# Patient Record
Sex: Female | Born: 1980 | Race: White | Hispanic: No | Marital: Single | Smoking: Never smoker
Health system: Southern US, Community
[De-identification: ages and names within clinical notes are randomized; demographics above are authoritative.]

## PROBLEM LIST (undated history)

## (undated) DIAGNOSIS — Z8759 Personal history of other complications of pregnancy, childbirth and the puerperium: Secondary | ICD-10-CM

## (undated) DIAGNOSIS — Z8619 Personal history of other infectious and parasitic diseases: Secondary | ICD-10-CM

## (undated) HISTORY — PX: WISDOM TOOTH EXTRACTION: SHX21

## (undated) HISTORY — DX: Personal history of other complications of pregnancy, childbirth and the puerperium: Z87.59

## (undated) HISTORY — DX: Personal history of other infectious and parasitic diseases: Z86.19

---

## 2004-11-26 ENCOUNTER — Emergency Department (HOSPITAL_COMMUNITY): Admission: EM | Admit: 2004-11-26 | Discharge: 2004-11-26 | Payer: Self-pay | Admitting: Emergency Medicine

## 2006-04-06 IMAGING — CR DG FOOT COMPLETE 3+V*R*
3 series · 3 of 3 positions shown · non-contrast
Comparison: None.

CLINICAL DATA: Fell and injured right ankle. The lateral pain and swelling.

RIGHT ANKLE - 3 VIEW  11/26/2004:

[t foot ap right]
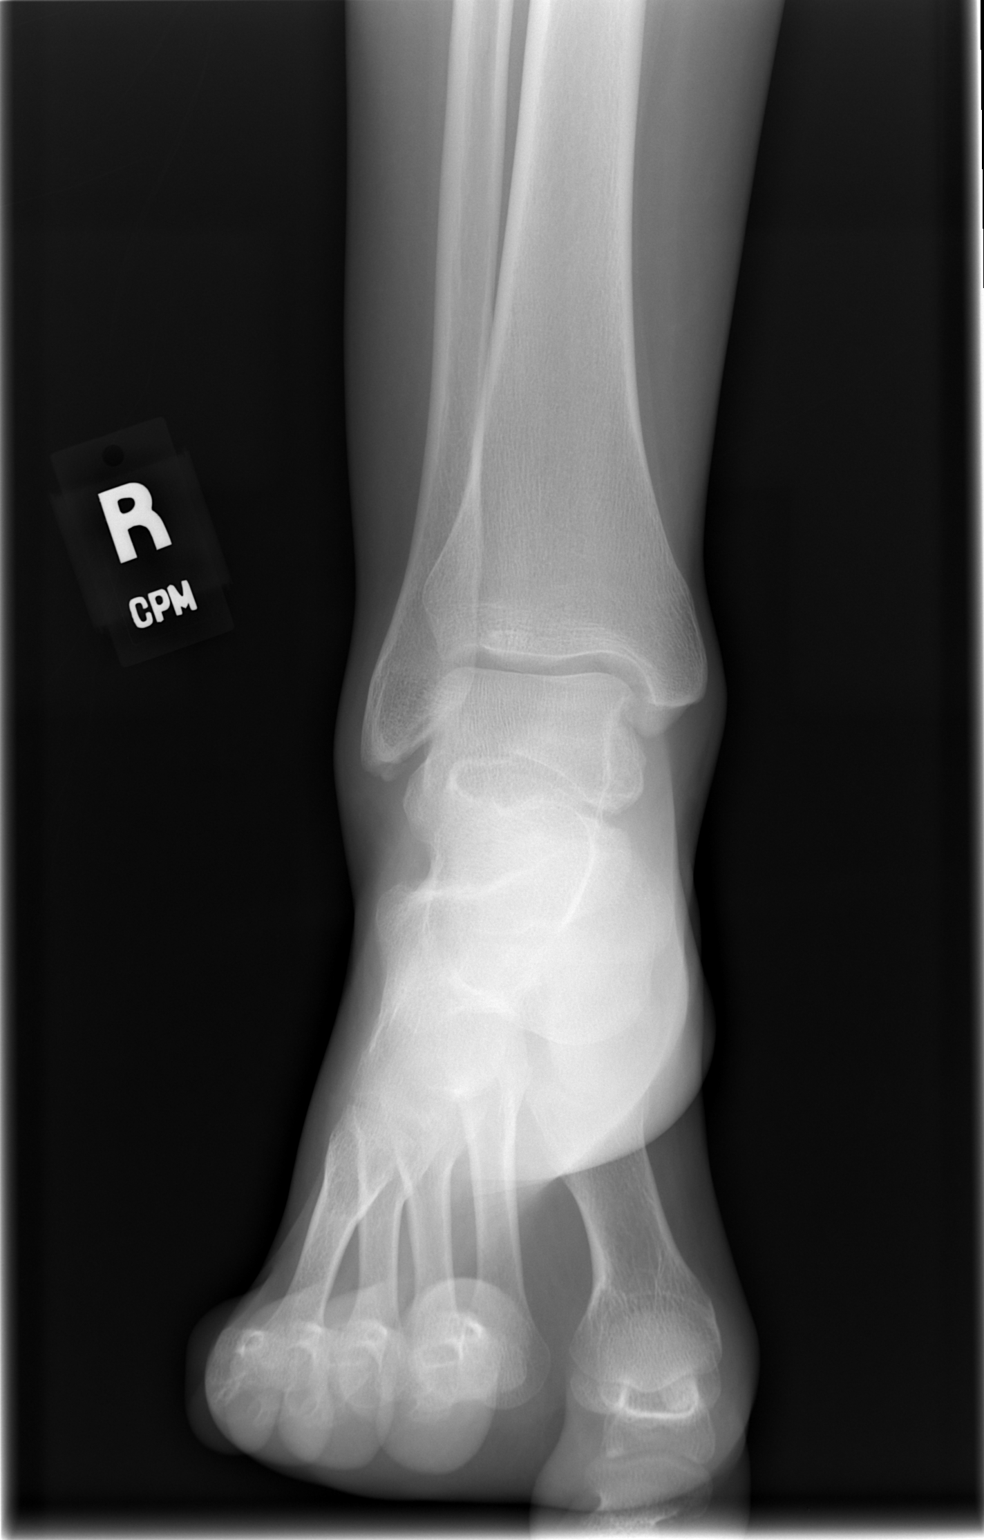

[t foot oblique right]
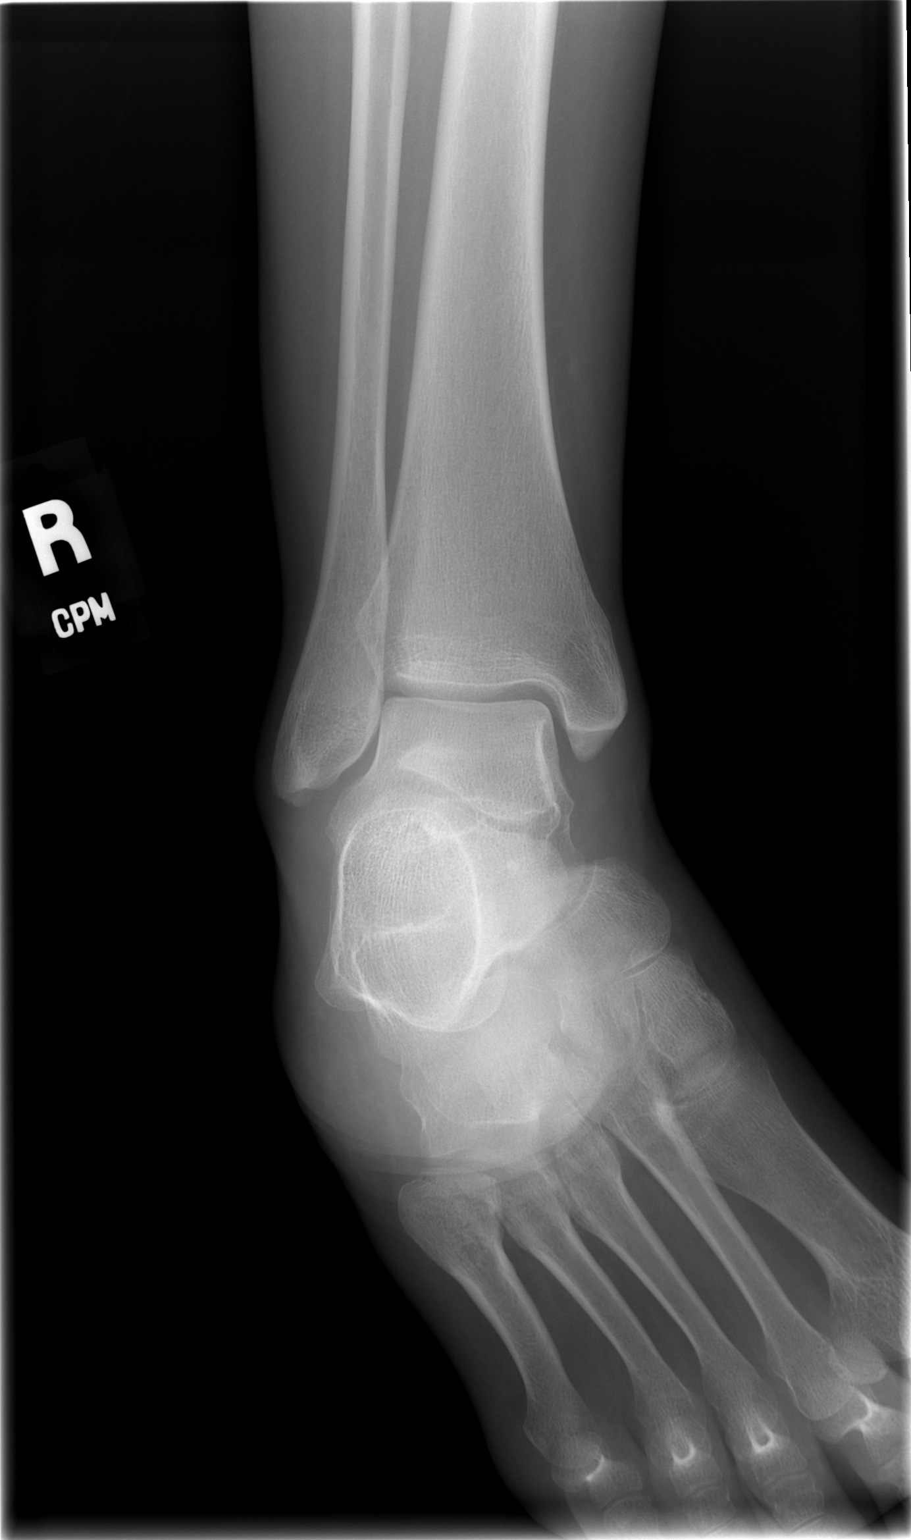

[t foot lat right]
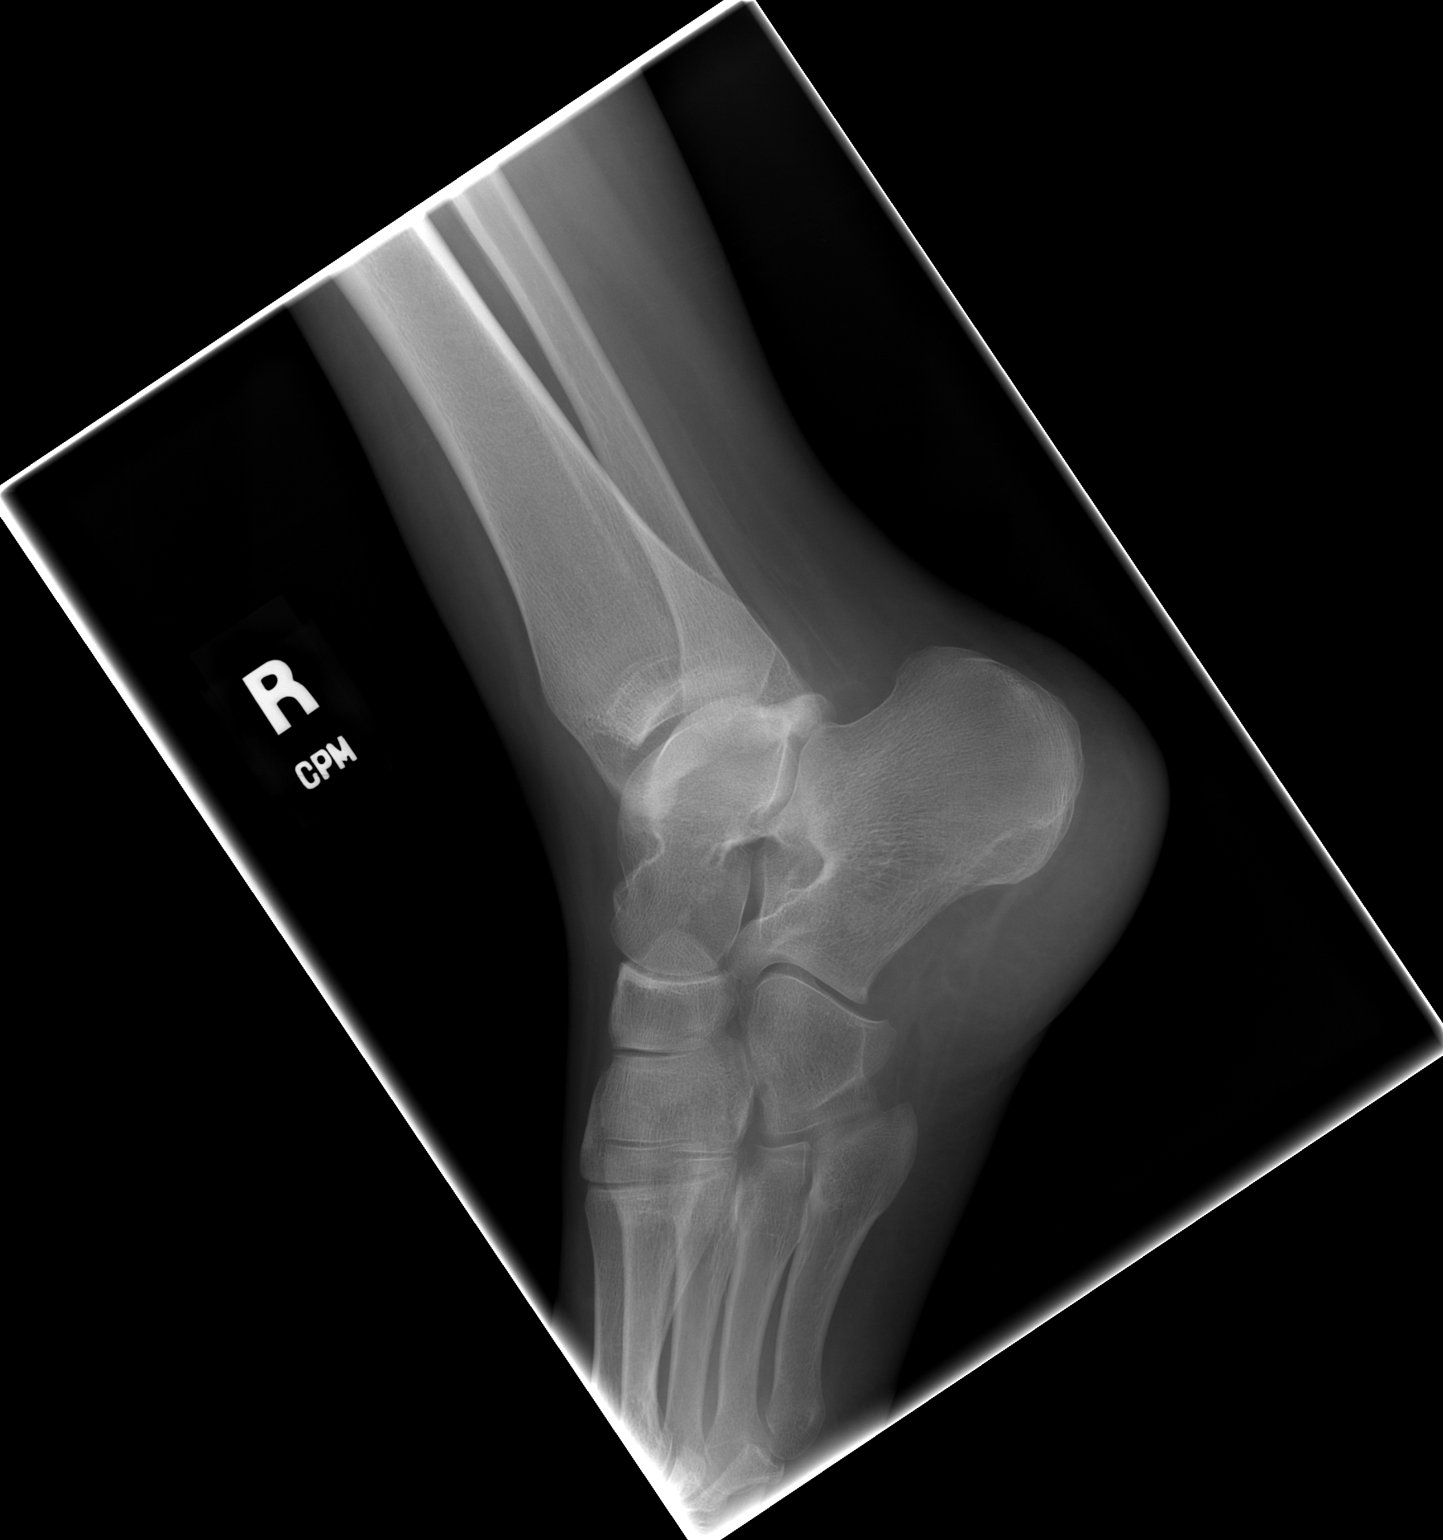

[3 of 3 positions shown; findings below may reference images not displayed]

FINDINGS: There is a small avulsion fracture arising from the inner aspect of
the tip in the lateral malleolus. No other fractures are identified. The ankle
mortise is intact. Lateral and medial soft tissue swelling is present.
IMPRESSION: Small avulsion fracture arising from the tip of the lateral malleolus.

## 2006-07-31 ENCOUNTER — Other Ambulatory Visit: Admission: RE | Admit: 2006-07-31 | Discharge: 2006-07-31 | Payer: Self-pay | Admitting: Endocrinology

## 2008-09-30 ENCOUNTER — Other Ambulatory Visit: Admission: RE | Admit: 2008-09-30 | Discharge: 2008-09-30 | Payer: Self-pay | Admitting: Family Medicine

## 2010-04-30 NOTE — L&D Delivery Note (Signed)
Delivery Note At 10:50 PM a viable and healthy female was delivered via Vaginal, Spontaneous Delivery (Presentation: Right Occiput Anterior).  APGAR: 9, 9; weight 6 lb 6.7 oz (2910 g).   Placenta status: Intact, Spontaneous.  Cord: 3 vessels with the following complications: None.   Anesthesia: Epidural  Episiotomy: Median Lacerations: None Suture Repair: 3.0 vicryl Est. Blood Loss (mL): 400  Mom to postpartum.  Baby to nursery-stable.  Amardeep Beckers D 11/20/2010, 11:08 PM

## 2010-06-09 LAB — ABO/RH: RH Type: POSITIVE

## 2010-06-09 LAB — HIV ANTIBODY (ROUTINE TESTING W REFLEX): HIV: NONREACTIVE

## 2010-06-09 LAB — HEPATITIS B SURFACE ANTIGEN: Hepatitis B Surface Ag: NEGATIVE

## 2010-06-09 LAB — TYPE AND SCREEN: Antibody Screen: NEGATIVE

## 2010-11-19 ENCOUNTER — Inpatient Hospital Stay (HOSPITAL_COMMUNITY): Admission: RE | Admit: 2010-11-19 | Payer: Self-pay | Source: Ambulatory Visit

## 2010-11-19 ENCOUNTER — Inpatient Hospital Stay (HOSPITAL_COMMUNITY)
Admission: AD | Admit: 2010-11-19 | Discharge: 2010-11-22 | DRG: 774 | Disposition: A | Discharge status: Home / Self Care | Payer: Self-pay | Source: Ambulatory Visit | Attending: Obstetrics and Gynecology | Admitting: Obstetrics and Gynecology

## 2010-11-19 ENCOUNTER — Encounter (HOSPITAL_COMMUNITY): Payer: Self-pay | Admitting: *Deleted

## 2010-11-19 ENCOUNTER — Other Ambulatory Visit: Payer: Self-pay | Admitting: Obstetrics and Gynecology

## 2010-11-19 DIAGNOSIS — IMO0002 Reserved for concepts with insufficient information to code with codable children: Principal | ICD-10-CM | POA: Diagnosis present

## 2010-11-19 DIAGNOSIS — O99892 Other specified diseases and conditions complicating childbirth: Secondary | ICD-10-CM | POA: Diagnosis present

## 2010-11-19 DIAGNOSIS — Z22330 Carrier of Group B streptococcus: Secondary | ICD-10-CM

## 2010-11-19 DIAGNOSIS — O1400 Mild to moderate pre-eclampsia, unspecified trimester: Secondary | ICD-10-CM

## 2010-11-19 LAB — CBC
HCT: 33.6 % — ABNORMAL LOW (ref 36.0–46.0)
MCV: 88.9 fL (ref 78.0–100.0)
RBC: 3.78 MIL/uL — ABNORMAL LOW (ref 3.87–5.11)
WBC: 9.8 10*3/uL (ref 4.0–10.5)

## 2010-11-19 LAB — URIC ACID: Uric Acid, Serum: 5.6 mg/dL (ref 2.4–7.0)

## 2010-11-19 MED ORDER — LIDOCAINE HCL (PF) 1 % IJ SOLN
30.0000 mL | INTRAMUSCULAR | Status: DC | PRN
  Filled 2010-11-19: qty 30

## 2010-11-19 MED ORDER — LACTATED RINGERS IV SOLN
INTRAVENOUS | Status: DC
  Administered 2010-11-19 – 2010-11-20 (×3): via INTRAVENOUS

## 2010-11-19 MED ORDER — LACTATED RINGERS IV SOLN: 500.0000 mL | INTRAVENOUS | Status: DC | PRN

## 2010-11-19 MED ORDER — CITRIC ACID-SODIUM CITRATE 334-500 MG/5ML PO SOLN: 30.0000 mL | ORAL | Status: DC | PRN

## 2010-11-19 MED ORDER — ZOLPIDEM TARTRATE 10 MG PO TABS
10.0000 mg | ORAL_TABLET | Freq: Every evening | ORAL | Status: DC | PRN
  Administered 2010-11-20: 10 mg via ORAL
  Filled 2010-11-19: qty 1

## 2010-11-19 MED ORDER — PENICILLIN G POTASSIUM 5000000 UNITS IJ SOLR
2.5000 10*6.[IU] | INTRAVENOUS | Status: DC
  Administered 2010-11-20 (×6): 2.5 10*6.[IU] via INTRAVENOUS
  Filled 2010-11-19 (×11): qty 2.5

## 2010-11-19 MED ORDER — ACETAMINOPHEN 325 MG PO TABS: 650.0000 mg | ORAL_TABLET | ORAL | Status: DC | PRN

## 2010-11-19 MED ORDER — ONDANSETRON HCL 4 MG/2ML IJ SOLN: 4.0000 mg | Freq: Four times a day (QID) | INTRAMUSCULAR | Status: DC | PRN

## 2010-11-19 MED ORDER — LIDOCAINE HCL (PF) 1 % IJ SOLN: 30.0000 mL | INTRAMUSCULAR | Status: DC | PRN

## 2010-11-19 MED ORDER — IBUPROFEN 600 MG PO TABS: 600.0000 mg | ORAL_TABLET | Freq: Four times a day (QID) | ORAL | Status: DC | PRN

## 2010-11-19 MED ORDER — OXYTOCIN 20 UNITS IN LACTATED RINGERS INFUSION - SIMPLE
125.0000 mL/h | Freq: Once | INTRAVENOUS | Status: DC
  Administered 2010-11-20: 125 mL/h via INTRAVENOUS

## 2010-11-19 MED ORDER — PENICILLIN G POTASSIUM 5000000 UNITS IJ SOLR
5.0000 10*6.[IU] | Freq: Once | INTRAMUSCULAR | Status: DC
  Administered 2010-11-19: 5 10*6.[IU] via INTRAVENOUS
  Filled 2010-11-19: qty 5

## 2010-11-19 MED ORDER — TERBUTALINE SULFATE 1 MG/ML IJ SOLN: 0.2500 mg | Freq: Once | INTRAMUSCULAR | Status: AC | PRN

## 2010-11-19 MED ORDER — OXYCODONE-ACETAMINOPHEN 5-325 MG PO TABS: 2.0000 | ORAL_TABLET | ORAL | Status: DC | PRN

## 2010-11-19 MED ORDER — MISOPROSTOL 25 MCG QUARTER TABLET
25.0000 ug | ORAL_TABLET | ORAL | Status: DC | PRN
  Administered 2010-11-19 – 2010-11-20 (×3): 25 ug via VAGINAL
  Filled 2010-11-19 (×3): qty 0.25

## 2010-11-19 MED ORDER — LACTATED RINGERS IV SOLN
500.0000 mL | INTRAVENOUS | Status: DC | PRN
  Administered 2010-11-20: 1000 mL via INTRAVENOUS

## 2010-11-19 MED ORDER — OXYTOCIN 20 UNITS IN LACTATED RINGERS INFUSION - SIMPLE: 125.0000 mL/h | Freq: Once | INTRAVENOUS | Status: DC

## 2010-11-19 MED ORDER — LACTATED RINGERS IV SOLN: INTRAVENOUS | Status: DC

## 2010-11-19 NOTE — H&P (Signed)
NAMEDODI, LEU               ACCOUNT NO.:  [id number]  MEDICAL RECORD NO.:  [id number]  LOCATION:  9169                          FACILITY:  [facility]  PHYSICIAN:  Zenaida Niece, M.D.DATE OF BIRTH:  [birth year]  DATE OF ADMISSION:  11/19/2010 DATE OF DISCHARGE:                             HISTORY & PHYSICAL   CHIEF COMPLAINT:  Intrauterine pregnancy at 37 weeks with preeclampsia.  HISTORY OF PRESENT ILLNESS:  This is a 30 year old gravida 1, para 0 with an EGA of [redacted] weeks by an early ultrasound with a due date of December 09, 2010 who is admitted for cervical ripening and induction due to mild preeclampsia.  Prenatal care was fairly uncomplicated until she was seen on November 08, 2010.  At that time, blood pressure was okay at 134/80 but she had 1+ proteinuria.  She had no PIH symptoms at that time.  PIH labs at that point were normal.  An initial 24-hour urine showed less than 300 mg of protein.  Repeat 24-hour urine revealed 600 mg plus of protein.  She still feels fine but due to mild preeclampsia is being admitted for cervical ripening and induction.  Prenatal labs significant for a blood type of O+ with a negative antibody screen.  Rubella immune, hepatitis B surface antigen negative, HIV negative, gonorrhea and Chlamydia negative.  First trimester screen is normal.  MSAFP is normal. One-hour Glucola 99 and group B strep is positive.  PAST MEDICAL HISTORY:  Negative.  PAST SURGICAL HISTORY:  Wisdom tooth extraction.  ALLERGIES:  None known.  CURRENT MEDICATIONS:  None.  FAMILY HISTORY:  No GYN or colon cancer.  REVIEW OF SYSTEMS:  Normal complaints of pregnancy.  SOCIAL HISTORY:  She is single and the father of baby is involved.  She denies alcohol, tobacco, or drug use.  PHYSICAL EXAMINATION:  GENERAL:  This is a well-developed gravid female in no acute distress. VITAL SIGNS:  Last weight in the office is 202 pounds and blood pressure is 142/90. NECK:  Supple without  lymphadenopathy or thyromegaly. LUNGS:  Clear to auscultation. HEART:  Regular rate and rhythm without murmur. ABDOMEN:  Gravid, nontender with a fundal height of 37 cm and estimated fetal weight of 7-7.5 pounds. GU:  Cervix is fingertip, 30, and -3 with a vertex presentation.  ASSESSMENT:  Intrauterine pregnancy at 37+ weeks with mild preeclampsia. The patient has no significant symptoms but blood pressures are slightly elevated and she has a proteinuria making a diagnosis of preeclampsia. Her other labs have been normal.  PLAN:  To admit the patient on the evening of November 19, 2010 for cervical ripening with Cytotec followed hopefully by induction with Pitocin.     Zenaida Niece, M.D.     TDM/MEDQ  D:  11/19/2010  T:  11/19/2010  Job:  [id number]

## 2010-11-20 ENCOUNTER — Encounter (HOSPITAL_COMMUNITY): Payer: Self-pay | Admitting: *Deleted

## 2010-11-20 ENCOUNTER — Inpatient Hospital Stay (HOSPITAL_COMMUNITY): Payer: Self-pay | Admitting: Anesthesiology

## 2010-11-20 ENCOUNTER — Encounter (HOSPITAL_COMMUNITY): Payer: Self-pay | Admitting: Anesthesiology

## 2010-11-20 DIAGNOSIS — O1400 Mild to moderate pre-eclampsia, unspecified trimester: Secondary | ICD-10-CM | POA: Diagnosis present

## 2010-11-20 LAB — CBC
HCT: 35 % — ABNORMAL LOW (ref 36.0–46.0)
MCHC: 33.1 g/dL (ref 30.0–36.0)
MCV: 88.6 fL (ref 78.0–100.0)
RDW: 12.8 % (ref 11.5–15.5)

## 2010-11-20 LAB — COMPREHENSIVE METABOLIC PANEL
ALT: 12 U/L (ref 0–35)
AST: 18 U/L (ref 0–37)
CO2: 23 mEq/L (ref 19–32)
Chloride: 102 mEq/L (ref 96–112)
GFR calc non Af Amer: >60 mL/min (ref >60)
Potassium: 3.9 mEq/L (ref 3.5–5.1)
Sodium: 134 mEq/L — ABNORMAL LOW (ref 135–145)
Total Bilirubin: 0.2 mg/dL — ABNORMAL LOW (ref 0.3–1.2)

## 2010-11-20 MED ORDER — LACTATED RINGERS IV SOLN: 500.0000 mL | Freq: Once | INTRAVENOUS | Status: DC

## 2010-11-20 MED ORDER — PHENYLEPHRINE 40 MCG/ML (10ML) SYRINGE FOR IV PUSH (FOR BLOOD PRESSURE SUPPORT): 80.0000 ug | PREFILLED_SYRINGE | INTRAVENOUS | Status: DC | PRN

## 2010-11-20 MED ORDER — OXYTOCIN 20 UNITS IN LACTATED RINGERS INFUSION - SIMPLE
1.0000 m[IU]/min | INTRAVENOUS | Status: DC
  Administered 2010-11-20: 1 m[IU]/min via INTRAVENOUS
  Administered 2010-11-20: 2 m[IU]/min via INTRAVENOUS
  Administered 2010-11-20: 6 m[IU]/min via INTRAVENOUS
  Filled 2010-11-20: qty 1000

## 2010-11-20 MED ORDER — PHENYLEPHRINE 40 MCG/ML (10ML) SYRINGE FOR IV PUSH (FOR BLOOD PRESSURE SUPPORT)
80.0000 ug | PREFILLED_SYRINGE | INTRAVENOUS | Status: DC | PRN
  Filled 2010-11-20: qty 5

## 2010-11-20 MED ORDER — LIDOCAINE HCL 1.5 % IJ SOLN
INTRAMUSCULAR | Status: DC | PRN
  Administered 2010-11-20: 5 mL
  Administered 2010-11-20: 3 mL
  Administered 2010-11-20: 5 mL

## 2010-11-20 MED ORDER — FENTANYL 2.5 MCG/ML BUPIVACAINE 1/10 % EPIDURAL INFUSION (WH - ANES)
14.0000 mL/h | INTRAMUSCULAR | Status: DC
  Administered 2010-11-20 (×2): 14 mL/h via EPIDURAL
  Filled 2010-11-20 (×2): qty 60

## 2010-11-20 MED ORDER — EPHEDRINE 5 MG/ML INJ
10.0000 mg | INTRAVENOUS | Status: DC | PRN
  Filled 2010-11-20: qty 4

## 2010-11-20 MED ORDER — EPHEDRINE 5 MG/ML INJ: 10.0000 mg | INTRAVENOUS | Status: DC | PRN

## 2010-11-20 MED ORDER — TERBUTALINE SULFATE 1 MG/ML IJ SOLN: 0.2500 mg | Freq: Once | INTRAMUSCULAR | Status: AC | PRN

## 2010-11-20 MED ORDER — DIPHENHYDRAMINE HCL 50 MG/ML IJ SOLN: 12.5000 mg | INTRAMUSCULAR | Status: DC | PRN

## 2010-11-20 NOTE — Progress Notes (Signed)
Feeling some ctx Afeb, VSS, BP 140/80 FHT- Cat I, irregular ctx on pitocin VE- 1-2/50/-2, vtx, AROM clear CMP essentially nl Continue pitocin and monitor progress, continue PCN for GBS

## 2010-11-20 NOTE — Anesthesia Procedure Notes (Signed)
Epidural Patient location during procedure: OB Start time: 11/20/2010 4:22 PM Reason for block: procedure for pain  Staffing Anesthesiologist: Layson Bertsch L.  Preanesthetic Checklist Completed: patient identified, site marked, surgical consent, pre-op evaluation, timeout performed, IV checked, risks and benefits discussed and monitors and equipment checked  Epidural Patient position: sitting Prep: site prepped and draped and DuraPrep Patient monitoring: continuous pulse ox and blood pressure Approach: midline Injection technique: LOR air  Needle:  Needle type: Tuohy  Needle gauge: 17 G Needle length: 9 cm Needle insertion depth: 5 cm cm Catheter type: closed end flexible Catheter size: 19 Gauge Catheter at skin depth: 10 cm Test dose: negative  Assessment Events: blood not aspirated, injection not painful, no injection resistance, negative IV test and no paresthesia  Additional Notes Discussed risk of headache, infection, bleeding, nerve injury and failed or incomplete block.  Patient voices understanding and wishes to proceed.

## 2010-11-20 NOTE — Plan of Care (Signed)
Progress Note Comfortable with epidural Afeb, VSS, BP stable FHT- Cat I, previous late decels resolved, ctx q 2-3 minutes on 1 mu/minute pitocin VE- 4/80/-1, vtx Continue pitocin and monitor FHT and progress, continue PCN for GBS

## 2010-11-20 NOTE — Progress Notes (Signed)
Dr. Jackelyn Knife notified of fhr pattern and interventions--order received to restart pitocin at 1800 with reassuring fhr

## 2010-11-20 NOTE — Anesthesia Preprocedure Evaluation (Signed)
Anesthesia Evaluation  Name, MR# and DOB Patient awake  General Assessment Comment  Reviewed: Allergy & Precautions, H&P  and Patient's Chart, lab work & pertinent test results  History of Anesthesia Complications Negative for: history of anesthetic complications  Airway Mallampati: I TM Distance: >3 FB Neck ROM: full    Dental  (+) Teeth Intact   Pulmonaryneg pulmonary ROS      pulmonary exam normal   Cardiovascular hypertension (mild preeclampsia), regular Normal   Neuro/PsychNegative Neurological ROS Negative Psych ROS  GI/Hepatic/Renal negative GI ROS, negative Liver ROS, and negative Renal ROS (+)       Endo/Other  Negative Endocrine ROS (+)   Abdominal   Musculoskeletal  Hematology negative hematology ROS (+)   Peds  Reproductive/Obstetrics (+) Pregnancy   Anesthesia Other Findings             Anesthesia Physical Anesthesia Plan  ASA: II  Anesthesia Plan: Epidural   Post-op Pain Management:    Induction:   Airway Management Planned:   Additional Equipment:   Intra-op Plan:   Post-operative Plan:   Informed Consent: I have reviewed the patients History and Physical, chart, labs and discussed the procedure including the risks, benefits and alternatives for the proposed anesthesia with the patient or authorized representative who has indicated his/her understanding and acceptance.     Plan Discussed with:   Anesthesia Plan Comments:         Anesthesia Quick Evaluation

## 2010-11-20 NOTE — Progress Notes (Signed)
Comfortable with epidural, feeling some pressure Afeb, VSS FHT- Cat II- some variable and late decels, mod variability, ctx not tracing well VE- C/C/+1 to +2 Will start pushing, anticipate SVD

## 2010-11-20 NOTE — Progress Notes (Signed)
Admitted last pm for ripening and induction for mild preeclampsia.  H&P dictated.  Received 3 doses of cytotec, last dose at 0530. Feels ok, no complaints, feeling some ctx Afeb, VSS, BP 110-140/70-90 FHT- Cat I, occ ctx Labs ok, no CMP done Will have nurse recheck cervix at 0930 and decide on pitocin vs. more cytotec.  On PCN for +GBS.  Will get CBC to complete labs for preeclampsia.

## 2010-11-21 ENCOUNTER — Inpatient Hospital Stay (HOSPITAL_COMMUNITY): Admission: RE | Admit: 2010-11-21 | Payer: Self-pay | Source: Ambulatory Visit

## 2010-11-21 LAB — CBC
HCT: 32.5 % — ABNORMAL LOW (ref 36.0–46.0)
Hemoglobin: 11 g/dL — ABNORMAL LOW (ref 12.0–15.0)
MCH: 29.8 pg (ref 26.0–34.0)
MCHC: 33.8 g/dL (ref 30.0–36.0)

## 2010-11-21 MED ORDER — ONDANSETRON HCL 4 MG/2ML IJ SOLN: 4.0000 mg | INTRAMUSCULAR | Status: DC | PRN

## 2010-11-21 MED ORDER — BD GETTING STARTED TAKE HOME KIT: 1/2ML X 30G SYRINGES
1.0000 | Freq: Once | Status: DC
  Filled 2010-11-21: qty 1

## 2010-11-21 MED ORDER — MAGNESIUM HYDROXIDE 400 MG/5ML PO SUSP: 30.0000 mL | ORAL | Status: DC | PRN

## 2010-11-21 MED ORDER — SENNOSIDES-DOCUSATE SODIUM 8.6-50 MG PO TABS: 1.0000 | ORAL_TABLET | Freq: Every day | ORAL | Status: DC

## 2010-11-21 MED ORDER — TETANUS-DIPHTH-ACELL PERTUSSIS 5-2.5-18.5 LF-MCG/0.5 IM SUSP
0.5000 mL | Freq: Once | INTRAMUSCULAR | Status: AC
  Administered 2010-11-21: 0.5 mL via INTRAMUSCULAR
  Filled 2010-11-21: qty 0.5

## 2010-11-21 MED ORDER — BENZOCAINE-MENTHOL 20-0.5 % EX AERO
INHALATION_SPRAY | CUTANEOUS | Status: AC
  Filled 2010-11-21: qty 56

## 2010-11-21 MED ORDER — IBUPROFEN 600 MG PO TABS
600.0000 mg | ORAL_TABLET | Freq: Four times a day (QID) | ORAL | Status: DC
  Administered 2010-11-21 – 2010-11-22 (×6): 600 mg via ORAL
  Filled 2010-11-21 (×6): qty 1

## 2010-11-21 MED ORDER — ZOLPIDEM TARTRATE 5 MG PO TABS: 5.0000 mg | ORAL_TABLET | Freq: Every evening | ORAL | Status: DC | PRN

## 2010-11-21 MED ORDER — DIPHENHYDRAMINE HCL 25 MG PO CAPS: 25.0000 mg | ORAL_CAPSULE | Freq: Four times a day (QID) | ORAL | Status: DC | PRN

## 2010-11-21 MED ORDER — PRENATAL PLUS 27-1 MG PO TABS: 1.0000 | ORAL_TABLET | Freq: Every day | ORAL | Status: DC

## 2010-11-21 MED ORDER — BENZOCAINE-MENTHOL 20-0.5 % EX AERO: 1.0000 "application " | INHALATION_SPRAY | CUTANEOUS | Status: DC | PRN

## 2010-11-21 MED ORDER — WITCH HAZEL-GLYCERIN EX PADS: MEDICATED_PAD | CUTANEOUS | Status: DC | PRN

## 2010-11-21 MED ORDER — SIMETHICONE 80 MG PO CHEW: 80.0000 mg | CHEWABLE_TABLET | ORAL | Status: DC | PRN

## 2010-11-21 MED ORDER — PRENATAL PLUS 27-1 MG PO TABS
1.0000 | ORAL_TABLET | Freq: Every day | ORAL | Status: DC
  Administered 2010-11-21 – 2010-11-22 (×2): 1 via ORAL
  Filled 2010-11-21 (×2): qty 1

## 2010-11-21 MED ORDER — ONDANSETRON HCL 4 MG PO TABS: 4.0000 mg | ORAL_TABLET | ORAL | Status: DC | PRN

## 2010-11-21 MED ORDER — MEASLES, MUMPS & RUBELLA VAC ~~LOC~~ INJ
0.5000 mL | INJECTION | Freq: Once | SUBCUTANEOUS | Status: DC
  Filled 2010-11-21: qty 0.5

## 2010-11-21 MED ORDER — OXYCODONE-ACETAMINOPHEN 5-325 MG PO TABS: 1.0000 | ORAL_TABLET | ORAL | Status: DC | PRN

## 2010-11-21 NOTE — Progress Notes (Signed)
UR Chart review completed.  

## 2010-11-21 NOTE — Progress Notes (Signed)
PPD#1, mild preeclampsia No problems, breastfeeding, bleeding ok Afeb, VSS, BP nl Fundus- firm, NT at U-0 Continue routine postpartum care

## 2010-11-21 NOTE — Anesthesia Postprocedure Evaluation (Signed)
  Anesthesia Post-op Note  Patient: Vanessa Herring  Procedure(s) Performed: * No procedures listed *   Patient stable following vaginal delivery.  Epidural catheter removed.

## 2010-11-21 NOTE — Progress Notes (Signed)
SW consult received for "babies who have drug screen sent." SW reviewed babies chart and there has not been any drug screens ordered.  Therefore, SW has screened out this referral as an error.  SW will only see if appropriate consult is ordered. 

## 2010-11-21 NOTE — Progress Notes (Signed)
Declined assistance, states breastfeeding going well, will call us if needed.

## 2010-11-22 NOTE — Progress Notes (Signed)
  PPD#2 No problems Afeb, VSS, BP nl D/c home

## 2010-11-22 NOTE — Discharge Summary (Signed)
Obstetric Discharge Summary Reason for Admission: induction of labor Prenatal Procedures: none Intrapartum Procedures: spontaneous vaginal delivery Postpartum Procedures: none Complications-Operative and Postpartum: none  Hemoglobin  Date Value Range Status  11/21/2010 11.0* 12.0-15.0 (g/dL) Final     HCT  Date Value Range Status  11/21/2010 32.5* 36.0-46.0 (%) Final    Discharge Diagnoses: Term Pregnancy-delivered and Preelampsia  Discharge Information: Date: 11/22/2010 Activity: pelvic rest Diet: routine Medications: Ibuprophen Condition: stable Instructions: refer to practice specific booklet Discharge to: home   Newborn Data: Live born  Information for the patient's newborn:  [name], [name] [[id number]]  female ; APGAR , ; weight ;  Home with mother.  Vanessa Herring 11/22/2010, 9:05 AM

## 2012-08-16 ENCOUNTER — Emergency Department (HOSPITAL_COMMUNITY)
Admission: EM | Admit: 2012-08-16 | Discharge: 2012-08-16 | Disposition: A | Discharge status: Home / Self Care | Payer: BC Managed Care – PPO | Attending: Emergency Medicine | Admitting: Emergency Medicine

## 2012-08-16 ENCOUNTER — Encounter (HOSPITAL_COMMUNITY): Payer: Self-pay | Admitting: Emergency Medicine

## 2012-08-16 DIAGNOSIS — IMO0002 Reserved for concepts with insufficient information to code with codable children: Secondary | ICD-10-CM | POA: Insufficient documentation

## 2012-08-16 DIAGNOSIS — T162XXA Foreign body in left ear, initial encounter: Secondary | ICD-10-CM

## 2012-08-16 DIAGNOSIS — T169XXA Foreign body in ear, unspecified ear, initial encounter: Secondary | ICD-10-CM | POA: Insufficient documentation

## 2012-08-16 DIAGNOSIS — Y92009 Unspecified place in unspecified non-institutional (private) residence as the place of occurrence of the external cause: Secondary | ICD-10-CM | POA: Insufficient documentation

## 2012-08-16 DIAGNOSIS — Y939 Activity, unspecified: Secondary | ICD-10-CM | POA: Insufficient documentation

## 2012-08-16 DIAGNOSIS — H9209 Otalgia, unspecified ear: Secondary | ICD-10-CM | POA: Insufficient documentation

## 2012-08-16 MED ORDER — NEOMYCIN-POLYMYXIN-HC 3.5-10000-1 OT SUSP: 4.0000 [drp] | Freq: Three times a day (TID) | OTIC | Status: DC

## 2012-08-16 NOTE — ED Provider Notes (Signed)
History     CSN: [id number]  Arrival date & time 08/16/12  0440   First MD Initiated Contact with Patient 08/16/12 [phone]      Chief Complaint  Patient presents with  . Foreign Body in Ear    (Consider location/radiation/quality/duration/timing/severity/associated sxs/prior treatment) Patient is a 32 y.o. female presenting with foreign body in ear. The history is provided by the patient and medical records. No language interpreter was used.  Foreign Body in Ear This is a new problem. The current episode started today. The problem occurs constantly. The problem has been unchanged. Pertinent negatives include no chills, fever, headaches, nausea or vomiting. Nothing aggravates the symptoms. Treatments tried: baby oil. The treatment provided moderate relief.   Lineth Thielke is a 32 y.o. female  with no medical history presents to the Emergency Department complaining of acute, persistent insect in left ear.  Patient states she awoke from sleep feeling the bug moving her ear. She states her husband the baby will any air which killed the [facility] however she's been unable to get it out. Associated symptoms include pain in her left ear. Patient denies fever, chills, headache, neck pain, rash, decreased hearing.  Baby oil made it better and nothing makes it worse.   History reviewed. No pertinent past medical history.  History reviewed. No pertinent past surgical history.  Family History  Problem Relation Age of Onset  . Depression Mother     History  Substance Use Topics  . Smoking status: Never Smoker   . Smokeless tobacco: Never Used  . Alcohol Use: No    OB History   Grav Para Term Preterm Abortions TAB SAB Ect Mult Living   1 1 1       1       Review of Systems  Constitutional: Negative for fever and chills.  HENT: Positive for ear pain. Negative for hearing loss, nosebleeds, neck stiffness and tinnitus.   Eyes: Negative for visual disturbance.  Gastrointestinal: Negative for  nausea and vomiting.  Allergic/Immunologic: Negative for immunocompromised state.  Neurological: Negative for light-headedness and headaches.  Hematological: Does not bruise/bleed easily.  Psychiatric/Behavioral: The patient is not nervous/anxious.     Allergies  Review of patient's allergies indicates no known allergies.  Home Medications   Current Outpatient Rx  Name  Route  Sig  Dispense  Refill  . fexofenadine (ALLEGRA) 180 MG tablet   Oral   Take 180 mg by mouth daily as needed (allergies).         . neomycin-polymyxin-hydrocortisone (CORTISPORIN) 3.5-10000-1 otic suspension   Left Ear   Place 4 drops into the left ear 3 (three) times daily. For 5 days   10 mL   0     BP 117/73  Pulse 64  Temp(Src) 97.8 F (36.6 C) (Oral)  Resp 20  SpO2 100%  Physical Exam  Nursing note and vitals reviewed. Constitutional: She appears well-developed and well-nourished. No distress.  HENT:  Head: Normocephalic and atraumatic.  Right Ear: Tympanic membrane, external ear and ear canal normal.  Left Ear: External ear normal. A foreign body (roach) is present.  Nose: Nose normal. No mucosal edema or rhinorrhea.  Mouth/Throat: Uvula is midline, oropharynx is clear and moist and mucous membranes are normal. Mucous membranes are not dry. No oropharyngeal exudate, posterior oropharyngeal edema, posterior oropharyngeal erythema or tonsillar abscesses.  Eyes: Conjunctivae are normal. No scleral icterus.  Neck: Normal range of motion.  Cardiovascular: Normal rate, regular rhythm, normal heart sounds and intact distal  pulses.   No murmur heard. Pulmonary/Chest: Effort normal and breath sounds normal. No respiratory distress. She has no wheezes.  Musculoskeletal: Normal range of motion.  Neurological: She is alert.  Speech is clear and goal oriented Moves extremities without ataxia  Skin: Skin is warm and dry. She is not diaphoretic.  Psychiatric: She has a normal mood and affect.     ED Course  FOREIGN BODY REMOVAL Date/Time: 08/16/2012 5:11 AM Performed by: Dierdre Forth Authorized by: Dierdre Forth Consent: Verbal consent obtained. Risks and benefits: risks, benefits and alternatives were discussed Consent given by: patient Patient understanding: patient states understanding of the procedure being performed Patient consent: the patient's understanding of the procedure matches consent given Procedure consent: procedure consent matches procedure scheduled Relevant documents: relevant documents present and verified Site marked: the operative site was marked Required items: required blood products, implants, devices, and special equipment available Patient identity confirmed: verbally with patient and arm band Time out: Immediately prior to procedure a "time out" was called to verify the correct patient, procedure, equipment, support staff and site/side marked as required. Body area: ear Location details: left ear Patient sedated: no Patient restrained: no Patient cooperative: yes Localization method: ENT speculum and visualized Removal mechanism: alligator forceps Complexity: simple 1 objects recovered. Objects recovered: roach Post-procedure assessment: foreign body removed Patient tolerance: Patient tolerated the procedure well with no immediate complications. Comments: Intact roach removed from L ear with alligator forceps without complication   (including critical care time)  Labs Reviewed - No data to display No results found.   1. Foreign body in ear, left, initial encounter       MDM  Erlene Senters presents with c/o insect in her ear.  On exam pt does have a roach in her L ear.  Carolin Coy removed without difficulty.  TM intact and abrasion noted to the canal wall but no visible laceration to the wall.  Ear flushed with warm soapy water afterwards.  Will d/c home with polymyxin drops and pt given ENT f/u if needed.  I have also  discussed reasons to return immediately to the ER.  Patient expresses understanding and agrees with plan.          Dahlia Client Aviyana Sonntag, PA-C 08/16/12 [phone]

## 2012-08-16 NOTE — ED Notes (Signed)
Pt alert, arrives from home, c/o ? FB in left ear, onset this evening, believed to be an insect, states poured baby oil into ear, resp even unlabored, skin pwd

## 2012-08-16 NOTE — ED Provider Notes (Signed)
Medical screening examination/treatment/procedure(s) were performed by non-physician practitioner and as supervising physician I was immediately available for consultation/collaboration.  Donnetta Hutching, MD 08/16/12 2340

## 2014-03-01 ENCOUNTER — Encounter (HOSPITAL_COMMUNITY): Payer: Self-pay | Admitting: Emergency Medicine

## 2014-09-06 ENCOUNTER — Encounter (HOSPITAL_COMMUNITY): Payer: Self-pay | Admitting: Emergency Medicine

## 2014-09-06 ENCOUNTER — Emergency Department (HOSPITAL_COMMUNITY)
Admission: EM | Admit: 2014-09-06 | Discharge: 2014-09-06 | Disposition: A | Discharge status: Home / Self Care | Payer: BLUE CROSS/BLUE SHIELD | Source: Home / Self Care | Attending: Family Medicine | Admitting: Family Medicine

## 2014-09-06 DIAGNOSIS — T148 Other injury of unspecified body region: Secondary | ICD-10-CM

## 2014-09-06 DIAGNOSIS — W57XXXA Bitten or stung by nonvenomous insect and other nonvenomous arthropods, initial encounter: Secondary | ICD-10-CM

## 2014-09-06 MED ORDER — TRIAMCINOLONE ACETONIDE 0.1 % EX CREA: 1.0000 "application " | TOPICAL_CREAM | Freq: Two times a day (BID) | CUTANEOUS | Status: DC

## 2014-09-06 NOTE — ED Notes (Signed)
C/o insect bite on left side of breast States bite does itch States the bite left a red streak which is radiating upward on breast  Denies any discharge

## 2014-09-06 NOTE — ED Provider Notes (Signed)
Vanessa Herring is a 34 y.o. female who presents to Urgent Care today for rash. Patient has a circular erythematous itchy lesion rash underneath her left breast that has streaking to the left breast. This is not painful and has been present for a few days. She denies any tick bites. No fevers or chills nausea vomiting or diarrhea.   History reviewed. No pertinent past medical history. History reviewed. No pertinent past surgical history. History  Substance Use Topics  . Smoking status: Never Smoker   . Smokeless tobacco: Never Used  . Alcohol Use: No   ROS as above Medications: No current facility-administered medications for this encounter.   Current Outpatient Prescriptions  Medication Sig Dispense Refill  . fexofenadine (ALLEGRA) 180 MG tablet Take 180 mg by mouth daily as needed (allergies).    . neomycin-polymyxin-hydrocortisone (CORTISPORIN) 3.5-10000-1 otic suspension Place 4 drops into the left ear 3 (three) times daily. For 5 days 10 mL 0  . triamcinolone cream (KENALOG) 0.1 % Apply 1 application topically 2 (two) times daily. 30 g 1   No Known Allergies   Exam:  BP 117/62 mmHg  Pulse 58  Temp(Src) 98.9 F (37.2 C) (Oral)  Resp 16  SpO2 99%  LMP 08/12/2014 Gen: Well NAD HEENT: EOMI,  MMM Lungs: Normal work of breathing. CTABL Heart: RRR no MRG Abd: NABS, Soft. Nondistended, Nontender Exts: Brisk capillary refill, warm and well perfused.  Skin: Circular erythematous nontender nonindurated nonfluctuant macular lesion about 2 cm in diameter with mild streaking towards the left nipple.  No results found for this or any previous visit (from the past 24 hour(s)). No results found.  Assessment and Plan: 34 y.o. female with bug bite with lymph streaking. Doubtful for infection and it is not painful without induration or tenderness. Treat with triamcinolone cream and oral antihistamines. Watchful waiting.  Discussed warning signs or symptoms. Please see discharge  instructions. Patient expresses understanding.     Rodolph BongEvan S Gianni Mihalik, MD 09/06/14 1059

## 2014-09-06 NOTE — Discharge Instructions (Signed)
Thank you for coming in today. Come back if this gets painful or worse.  Insect Bite Mosquitoes, flies, fleas, bedbugs, and many other insects can bite. Insect bites are different from insect stings. A sting is when venom is injected into the skin. Some insect bites can transmit infectious diseases. SYMPTOMS  Insect bites usually turn red, swell, and itch for 2 to 4 days. They often go away on their own. TREATMENT  Your caregiver may prescribe antibiotic medicines if a bacterial infection develops in the bite. HOME CARE INSTRUCTIONS  Do not scratch the bite area.  Keep the bite area clean and dry. Wash the bite area thoroughly with soap and water.  Put ice or cool compresses on the bite area.  Put ice in a plastic bag.  Place a towel between your skin and the bag.  Leave the ice on for 20 minutes, 4 times a day for the first 2 to 3 days, or as directed.  You may apply a baking soda paste, cortisone cream, or calamine lotion to the bite area as directed by your caregiver. This can help reduce itching and swelling.  Only take over-the-counter or prescription medicines as directed by your caregiver.  If you are given antibiotics, take them as directed. Finish them even if you start to feel better. You may need a tetanus shot if:  You cannot remember when you had your last tetanus shot.  You have never had a tetanus shot.  The injury broke your skin. If you get a tetanus shot, your arm may swell, get red, and feel warm to the touch. This is common and not a problem. If you need a tetanus shot and you choose not to have one, there is a rare chance of getting tetanus. Sickness from tetanus can be serious. SEEK IMMEDIATE MEDICAL CARE IF:   You have increased pain, redness, or swelling in the bite area.  You see a red line on the skin coming from the bite.  You have a fever.  You have joint pain.  You have a headache or neck pain.  You have unusual weakness.  You have a  rash.  You have chest pain or shortness of breath.  You have abdominal pain, nausea, or vomiting.  You feel unusually tired or sleepy. MAKE SURE YOU:   Understand these instructions.  Will watch your condition.  Will get help right away if you are not doing well or get worse. Document Released: 05/24/2004 Document Revised: 07/09/2011 Document Reviewed: 11/15/2010 [facility] Patient Information 2015 [employer], [place]. This information is not intended to replace advice given to you by your health care provider. Make sure you discuss any questions you have with your health care provider.

## 2015-05-01 NOTE — L&D Delivery Note (Signed)
Delivery Note Pt reached complete dilation and pushed great.  At 6:02 PM a healthy female was delivered via Vaginal, Spontaneous Delivery (Presentation: OA).  APGAR: 9, 9; weight pending .   Placenta status: delivered spontaneously .  Cord:  with the following complications: nuchal x 1 reduced .   Anesthesia:  epidural Episiotomy: None Lacerations: None Suture Repair: n/a Est. Blood Loss (mL):    Mom to postpartum.  Baby to Couplet care / Skin to Skin.  Oliver PilaICHARDSON,Dorothy Polhemus W 12/28/2015, 6:25 PM

## 2015-05-30 LAB — OB RESULTS CONSOLE RPR: RPR: NONREACTIVE

## 2015-05-30 LAB — OB RESULTS CONSOLE RUBELLA ANTIBODY, IGM: RUBELLA: IMMUNE

## 2015-05-30 LAB — OB RESULTS CONSOLE VARICELLA ZOSTER ANTIBODY, IGG: VARICELLA IGG: NON-IMMUNE/NOT IMMUNE

## 2015-05-30 LAB — OB RESULTS CONSOLE HIV ANTIBODY (ROUTINE TESTING): HIV: NONREACTIVE

## 2015-05-30 LAB — OB RESULTS CONSOLE GC/CHLAMYDIA
CHLAMYDIA, DNA PROBE: NEGATIVE
Gonorrhea: NEGATIVE

## 2015-05-30 LAB — OB RESULTS CONSOLE ANTIBODY SCREEN: Antibody Screen: NEGATIVE

## 2015-05-30 LAB — OB RESULTS CONSOLE ABO/RH: RH Type: POSITIVE

## 2015-05-30 LAB — OB RESULTS CONSOLE HEPATITIS B SURFACE ANTIGEN: HEP B S AG: NEGATIVE

## 2015-11-24 LAB — OB RESULTS CONSOLE GBS: GBS: NEGATIVE

## 2015-12-20 ENCOUNTER — Encounter (HOSPITAL_COMMUNITY): Payer: Self-pay | Admitting: *Deleted

## 2015-12-20 ENCOUNTER — Telehealth (HOSPITAL_COMMUNITY): Payer: Self-pay | Admitting: *Deleted

## 2015-12-20 NOTE — Telephone Encounter (Signed)
Preadmission screen  

## 2015-12-27 NOTE — H&P (Signed)
Vanessa SentersMary Herring is a 35 y.o. female G2P1001 at 6539 6/7 weeks (EDD 12/29/15 by LMP c/w 9 week US)  presenting for IOL at term.  Prenatal care complicated by h/o preeclampsia last pregnancy but BP WNL this pregnancy.  Also, low lying placenta that resolved on f/u scan.  No other issues.  OB History    Gravida Para Term Preterm AB Living   2 1 1     1    SAB TAB Ectopic Multiple Live Births           1    NSVD 2012 6#6oz 37 weeks preeclmpsia  Past Medical History:  Diagnosis Date  . History of pre-eclampsia   . Hx of varicella    Past Surgical History:  Procedure Laterality Date  . WISDOM TOOTH EXTRACTION     Family History: family history includes Depression in her mother. Social History:  reports that she has never smoked. She has never used smokeless tobacco. She reports that she does not drink alcohol or use drugs.     Maternal Diabetes: No Genetic Screening: Normal Maternal Ultrasounds/Referrals: Normal Fetal Ultrasounds or other Referrals:  None Maternal Substance Abuse:  No Significant Maternal Medications:  None Significant Maternal Lab Results:  None Other Comments:  None  Review of Systems  Neurological: Negative for headaches.   Maternal Medical History:  Contractions: Frequency: irregular.   Perceived severity is mild.    Fetal activity: Perceived fetal activity is normal.    Prenatal Complications - Diabetes: none.      Last menstrual period 03/24/2015, unknown if currently breastfeeding. Maternal Exam:  Uterine Assessment: Contraction strength is mild.  Contraction frequency is irregular.   Abdomen: Patient reports no abdominal tenderness. Fetal presentation: vertex  Introitus: Normal vulva. Normal vagina.    Physical Exam  Constitutional: She appears well-developed.  Cardiovascular: Normal rate and regular rhythm.   Respiratory: Effort normal.  GI: Soft.  Genitourinary: Vagina normal.  Neurological: She is alert.  Psychiatric: She has a normal mood  and affect.    Prenatal labs: ABO, Rh: O/Positive/-- (01/30 0000) Antibody: Negative (01/30 0000) Rubella: Immune (01/30 0000) RPR: Nonreactive (01/30 0000)  HBsAg: Negative (01/30 0000)  HIV: Non-reactive (01/30 0000)  GBS: Negative (07/27 0000)  One hour GCT WNL 96 Firsttrimester screen and AFP WNL CF negative last pregnancy  Assessment/Plan: Pt for IOL at term.  Plan AROM and pitocin.  Oliver PilaICHARDSON,Chaden Doom W 12/27/2015, 7:17 PM

## 2015-12-28 ENCOUNTER — Encounter (HOSPITAL_COMMUNITY): Payer: Self-pay

## 2015-12-28 ENCOUNTER — Inpatient Hospital Stay (HOSPITAL_COMMUNITY): Payer: BLUE CROSS/BLUE SHIELD | Admitting: Anesthesiology

## 2015-12-28 ENCOUNTER — Inpatient Hospital Stay (HOSPITAL_COMMUNITY)
Admission: RE | Admit: 2015-12-28 | Discharge: 2015-12-29 | DRG: 775 | Disposition: A | Discharge status: Home / Self Care | Payer: BLUE CROSS/BLUE SHIELD | Source: Ambulatory Visit | Attending: Obstetrics and Gynecology | Admitting: Obstetrics and Gynecology

## 2015-12-28 DIAGNOSIS — Z3A39 39 weeks gestation of pregnancy: Secondary | ICD-10-CM | POA: Diagnosis not present

## 2015-12-28 DIAGNOSIS — Z3483 Encounter for supervision of other normal pregnancy, third trimester: Secondary | ICD-10-CM | POA: Diagnosis present

## 2015-12-28 DIAGNOSIS — Z3490 Encounter for supervision of normal pregnancy, unspecified, unspecified trimester: Secondary | ICD-10-CM

## 2015-12-28 LAB — CBC
HCT: 36.9 % (ref 36.0–46.0)
Hemoglobin: 12.1 g/dL (ref 12.0–15.0)
MCH: 27.3 pg (ref 26.0–34.0)
MCHC: 32.8 g/dL (ref 30.0–36.0)
MCV: 83.3 fL (ref 78.0–100.0)
PLATELETS: 230 10*3/uL (ref 150–400)
RBC: 4.43 MIL/uL (ref 3.87–5.11)
RDW: 13.4 % (ref 11.5–15.5)
WBC: 11.3 10*3/uL — ABNORMAL HIGH (ref 4.0–10.5)

## 2015-12-28 LAB — TYPE AND SCREEN
ABO/RH(D): O POS
ANTIBODY SCREEN: NEGATIVE

## 2015-12-28 LAB — GLUCOSE, CAPILLARY: Glucose-Capillary: 71 mg/dL (ref 65–99)

## 2015-12-28 MED ORDER — PHENYLEPHRINE 40 MCG/ML (10ML) SYRINGE FOR IV PUSH (FOR BLOOD PRESSURE SUPPORT)
80.0000 ug | PREFILLED_SYRINGE | INTRAVENOUS | Status: DC | PRN
  Filled 2015-12-28: qty 5

## 2015-12-28 MED ORDER — TETANUS-DIPHTH-ACELL PERTUSSIS 5-2.5-18.5 LF-MCG/0.5 IM SUSP: 0.5000 mL | Freq: Once | INTRAMUSCULAR | Status: DC

## 2015-12-28 MED ORDER — EPHEDRINE 5 MG/ML INJ
10.0000 mg | INTRAVENOUS | Status: DC | PRN
  Filled 2015-12-28: qty 4

## 2015-12-28 MED ORDER — OXYCODONE-ACETAMINOPHEN 5-325 MG PO TABS: 1.0000 | ORAL_TABLET | ORAL | Status: DC | PRN

## 2015-12-28 MED ORDER — FENTANYL 2.5 MCG/ML BUPIVACAINE 1/10 % EPIDURAL INFUSION (WH - ANES): 14.0000 mL/h | INTRAMUSCULAR | Status: DC | PRN

## 2015-12-28 MED ORDER — LACTATED RINGERS IV SOLN: 500.0000 mL | Freq: Once | INTRAVENOUS | Status: DC

## 2015-12-28 MED ORDER — LIDOCAINE HCL (PF) 1 % IJ SOLN
30.0000 mL | INTRAMUSCULAR | Status: DC | PRN
  Filled 2015-12-28: qty 30

## 2015-12-28 MED ORDER — SOD CITRATE-CITRIC ACID 500-334 MG/5ML PO SOLN: 30.0000 mL | ORAL | Status: DC | PRN

## 2015-12-28 MED ORDER — LIDOCAINE HCL (PF) 1 % IJ SOLN
INTRAMUSCULAR | Status: DC | PRN
  Administered 2015-12-28 (×2): 5 mL

## 2015-12-28 MED ORDER — DIPHENHYDRAMINE HCL 50 MG/ML IJ SOLN: 12.5000 mg | INTRAMUSCULAR | Status: DC | PRN

## 2015-12-28 MED ORDER — BENZOCAINE-MENTHOL 20-0.5 % EX AERO
1.0000 | INHALATION_SPRAY | CUTANEOUS | Status: DC | PRN
Start: 2015-12-28 — End: 2015-12-30

## 2015-12-28 MED ORDER — ONDANSETRON HCL 4 MG PO TABS: 4.0000 mg | ORAL_TABLET | ORAL | Status: DC | PRN

## 2015-12-28 MED ORDER — WITCH HAZEL-GLYCERIN EX PADS: 1.0000 "application " | MEDICATED_PAD | CUTANEOUS | Status: DC | PRN

## 2015-12-28 MED ORDER — DIPHENHYDRAMINE HCL 25 MG PO CAPS: 25.0000 mg | ORAL_CAPSULE | Freq: Four times a day (QID) | ORAL | Status: DC | PRN

## 2015-12-28 MED ORDER — LACTATED RINGERS IV SOLN: 500.0000 mL | INTRAVENOUS | Status: DC | PRN

## 2015-12-28 MED ORDER — COCONUT OIL OIL: 1.0000 "application " | TOPICAL_OIL | Status: DC | PRN

## 2015-12-28 MED ORDER — LACTATED RINGERS IV SOLN
INTRAVENOUS | Status: DC
  Administered 2015-12-28: 08:00:00 via INTRAVENOUS

## 2015-12-28 MED ORDER — OXYTOCIN 40 UNITS IN LACTATED RINGERS INFUSION - SIMPLE MED: 2.5000 [IU]/h | INTRAVENOUS | Status: DC

## 2015-12-28 MED ORDER — ZOLPIDEM TARTRATE 5 MG PO TABS: 5.0000 mg | ORAL_TABLET | Freq: Every evening | ORAL | Status: DC | PRN

## 2015-12-28 MED ORDER — PHENYLEPHRINE 40 MCG/ML (10ML) SYRINGE FOR IV PUSH (FOR BLOOD PRESSURE SUPPORT)
80.0000 ug | PREFILLED_SYRINGE | INTRAVENOUS | Status: DC | PRN
  Filled 2015-12-28: qty 10
  Filled 2015-12-28: qty 5

## 2015-12-28 MED ORDER — ACETAMINOPHEN 325 MG PO TABS
650.0000 mg | ORAL_TABLET | ORAL | Status: DC | PRN
Start: 2015-12-28 — End: 2015-12-28

## 2015-12-28 MED ORDER — ACETAMINOPHEN 325 MG PO TABS: 650.0000 mg | ORAL_TABLET | ORAL | Status: DC | PRN

## 2015-12-28 MED ORDER — OXYCODONE-ACETAMINOPHEN 5-325 MG PO TABS: 2.0000 | ORAL_TABLET | ORAL | Status: DC | PRN

## 2015-12-28 MED ORDER — OXYCODONE HCL 5 MG PO TABS: 5.0000 mg | ORAL_TABLET | ORAL | Status: DC | PRN

## 2015-12-28 MED ORDER — FENTANYL CITRATE (PF) 100 MCG/2ML IJ SOLN: 50.0000 ug | INTRAMUSCULAR | Status: DC | PRN

## 2015-12-28 MED ORDER — PRENATAL MULTIVITAMIN CH
1.0000 | ORAL_TABLET | Freq: Every day | ORAL | Status: DC
  Administered 2015-12-29: 1 via ORAL
  Filled 2015-12-28: qty 1

## 2015-12-28 MED ORDER — IBUPROFEN 600 MG PO TABS
600.0000 mg | ORAL_TABLET | Freq: Four times a day (QID) | ORAL | Status: DC
  Administered 2015-12-29 (×4): 600 mg via ORAL
  Filled 2015-12-28 (×4): qty 1

## 2015-12-28 MED ORDER — OXYTOCIN BOLUS FROM INFUSION
500.0000 mL | Freq: Once | INTRAVENOUS | Status: AC
  Administered 2015-12-28: 666 m[IU]/min via INTRAVENOUS

## 2015-12-28 MED ORDER — OXYCODONE HCL 5 MG PO TABS: 10.0000 mg | ORAL_TABLET | ORAL | Status: DC | PRN

## 2015-12-28 MED ORDER — ONDANSETRON HCL 4 MG/2ML IJ SOLN: 4.0000 mg | INTRAMUSCULAR | Status: DC | PRN

## 2015-12-28 MED ORDER — ONDANSETRON HCL 4 MG/2ML IJ SOLN: 4.0000 mg | Freq: Four times a day (QID) | INTRAMUSCULAR | Status: DC | PRN

## 2015-12-28 MED ORDER — DIBUCAINE 1 % RE OINT: 1.0000 "application " | TOPICAL_OINTMENT | RECTAL | Status: DC | PRN

## 2015-12-28 MED ORDER — FENTANYL 2.5 MCG/ML BUPIVACAINE 1/10 % EPIDURAL INFUSION (WH - ANES)
14.0000 mL/h | INTRAMUSCULAR | Status: DC | PRN
  Administered 2015-12-28: 14 mL/h via EPIDURAL
  Filled 2015-12-28: qty 125

## 2015-12-28 MED ORDER — SENNOSIDES-DOCUSATE SODIUM 8.6-50 MG PO TABS
2.0000 | ORAL_TABLET | ORAL | Status: DC
  Administered 2015-12-29: 2 via ORAL
  Filled 2015-12-28: qty 2

## 2015-12-28 MED ORDER — OXYTOCIN 40 UNITS IN LACTATED RINGERS INFUSION - SIMPLE MED
1.0000 m[IU]/min | INTRAVENOUS | Status: DC
  Administered 2015-12-28: 2 m[IU]/min via INTRAVENOUS
  Filled 2015-12-28: qty 1000

## 2015-12-28 MED ORDER — SIMETHICONE 80 MG PO CHEW: 80.0000 mg | CHEWABLE_TABLET | ORAL | Status: DC | PRN

## 2015-12-28 MED ORDER — TERBUTALINE SULFATE 1 MG/ML IJ SOLN
0.2500 mg | Freq: Once | INTRAMUSCULAR | Status: DC | PRN
  Filled 2015-12-28: qty 1

## 2015-12-28 NOTE — Anesthesia Preprocedure Evaluation (Signed)
Anesthesia Evaluation  Patient identified by MRN, date of birth, ID band Patient awake    Reviewed: Allergy & Precautions, NPO status , Patient's Chart, lab work & pertinent test results  Airway Mallampati: II  TM Distance: >3 FB Neck ROM: Full    Dental no notable dental hx.    Pulmonary neg pulmonary ROS,    Pulmonary exam normal        Cardiovascular negative cardio ROS Normal cardiovascular exam     Neuro/Psych negative neurological ROS  negative psych ROS   GI/Hepatic negative GI ROS, Neg liver ROS,   Endo/Other  negative endocrine ROS  Renal/GU negative Renal ROS  negative genitourinary   Musculoskeletal negative musculoskeletal ROS (+)   Abdominal   Peds negative pediatric ROS (+)  Hematology negative hematology ROS (+)   Anesthesia Other Findings   Reproductive/Obstetrics (+) Pregnancy                             Anesthesia Physical Anesthesia Plan  ASA: II  Anesthesia Plan: Epidural   Post-op Pain Management:    Induction:   Airway Management Planned:   Additional Equipment:   Intra-op Plan:   Post-operative Plan:   Informed Consent:   Plan Discussed with:   Anesthesia Plan Comments:         Anesthesia Quick Evaluation  

## 2015-12-28 NOTE — Progress Notes (Signed)
Patient ID: Vanessa Herring, female   DOB: 01/10/1981, 35 y.o.   MRN: [id number] Comfortable with epidural  afeb vss FHR category 1  Cervix 6+/80/-1 to 0  IUPC placed and will adjust pitocin as needed. Expect vaginal delivery

## 2015-12-28 NOTE — Progress Notes (Signed)
Patient ID: Vanessa SentersMary Herring, female   DOB: 10/13/1980, 35 y.o.   MRN: [id number] Pt admitted and starting on pitocin  afeb vss Cervix 50/2/-2 AROM clear  FHR category 1  Will follow progress Epidural prn.

## 2015-12-28 NOTE — Anesthesia Procedure Notes (Signed)
Epidural Patient location during procedure: OB Start time: 12/28/2015 12:30 PM End time: 12/28/2015 12:35 PM  Staffing Anesthesiologist: Bonita QuinGUIDETTI, Vanessa Yadao S Performed: anesthesiologist   Preanesthetic Checklist Completed: patient identified, site marked, surgical consent, pre-op evaluation, timeout performed, IV checked, risks and benefits discussed and monitors and equipment checked  Epidural Patient position: sitting Prep: site prepped and draped and DuraPrep Patient monitoring: continuous pulse ox and blood pressure Approach: midline Location: L4-L5 Injection technique: LOR air  Needle:  Needle type: Tuohy  Needle gauge: 17 G Needle length: 9 cm and 9 Needle insertion depth: 5 cm and 8 cm Catheter type: closed end flexible Catheter size: 19 Gauge Catheter at skin depth: 13 cm Test dose: negative  Assessment Events: blood not aspirated, injection not painful, no injection resistance, negative IV test and no paresthesia

## 2015-12-28 NOTE — Lactation Note (Signed)
This note was copied from a baby's chart. Lactation Consultation Note  Patient Name: Girl Erlene SentersMary Loescher BJYNW'GToday's Date: 12/28/2015 Reason for consult: Initial assessment Baby at 4 hr of life. Mom reports baby is latching well. She denies breast or nipple pain. Baby was just coming off the breast upon entry. Discussed positioning and un swaddling baby while feeing. Mom had trouble bf her older child and when they got home she went to pump to feed. She would like to keep this baby at the breast and only pump as needed. Discussed baby behavior, feeding frequency, baby belly size, voids, wt loss, breast changes, and nipple care. Demonstrated manual expression, colostrum noted bilaterally, spoon in room. Given lactation handouts. Aware of OP services and support group.     Maternal Data Has patient been taught Hand Expression?: Yes Does the patient have breastfeeding experience prior to this delivery?: Yes  Feeding Feeding Type: Breast Fed Length of feed: 15 min  LATCH Score/Interventions                      Lactation Tools Discussed/Used WIC Program: No   Consult Status Consult Status: Follow-up Date: 12/29/15 Follow-up type: In-patient    Rulon Eisenmengerlizabeth E Anjelita Sheahan 12/28/2015, 10:06 PM

## 2015-12-28 NOTE — Progress Notes (Signed)
Patient ambulated out of bed with minimal to no assistance, pericare done independently. Voided 600 of clear yellow urine. Ambulated to bed with no assistance

## 2015-12-28 NOTE — Anesthesia Pain Management Evaluation Note (Signed)
  CRNA Pain Management Visit Note  Patient: Vanessa Herring, 35 y.o., female  "Hello I am a [facility] of the anesthesia team at [facility]. We have an anesthesia team available at all times to provide care throughout the hospital, including epidural management and anesthesia for C-section. I don't know your plan for the delivery whether it a natural birth, water birth, IV sedation, nitrous supplementation, doula or epidural, but we want to meet your pain goals."   1.Was your pain managed to your expectations on prior hospitalizations?   No prior hospitalizations  2.What is your expectation for pain management during this hospitalization?     Epidural and IV pain meds  3.How can we help you reach that goal? Comfort measures and support with IV meds   Record the patient's initial score and the patient's pain goal.   Pain: 0  Pain Goal: 4 [facility] wants you to be able to say your pain was always managed very well.  Vanessa Herring,Vanessa Herring 12/28/2015

## 2015-12-29 LAB — CBC
HCT: 33.6 % — ABNORMAL LOW (ref 36.0–46.0)
Hemoglobin: 10.8 g/dL — ABNORMAL LOW (ref 12.0–15.0)
MCH: 26.7 pg (ref 26.0–34.0)
MCHC: 32.1 g/dL (ref 30.0–36.0)
MCV: 83.2 fL (ref 78.0–100.0)
PLATELETS: 186 10*3/uL (ref 150–400)
RBC: 4.04 MIL/uL (ref 3.87–5.11)
RDW: 13.4 % (ref 11.5–15.5)
WBC: 15.2 10*3/uL — ABNORMAL HIGH (ref 4.0–10.5)

## 2015-12-29 MED ORDER — IBUPROFEN 600 MG PO TABS: 600.0000 mg | ORAL_TABLET | Freq: Four times a day (QID) | ORAL | 1 refills | Status: DC | PRN

## 2015-12-29 NOTE — Anesthesia Postprocedure Evaluation (Signed)
Anesthesia Post Note  Patient: Vanessa Herring  Procedure(s) Performed: * No procedures listed *  Patient location during evaluation: Mother Baby Anesthesia Type: Epidural Level of consciousness: awake and alert Pain management: pain level controlled Vital Signs Assessment: post-procedure vital signs reviewed and stable Respiratory status: spontaneous breathing, nonlabored ventilation and respiratory function stable Cardiovascular status: stable Postop Assessment: no headache, no backache and epidural receding Anesthetic complications: no     Last Vitals:  Vitals:   12/29/15 0100 12/29/15 0534  BP: 124/68 (!) 101/56  Pulse: 81 66  Resp: 18 18  Temp: 36.9 C 36.6 C    Last Pain:  Vitals:   12/29/15 0710  TempSrc:   PainSc: 4    Pain Goal:                 Junious SilkGILBERT,Damali Broadfoot

## 2015-12-29 NOTE — Lactation Note (Signed)
This note was copied from a baby's chart. Lactation Consultation Note  Patient Name: Vanessa Herring JXBJY'NToday's Date: 12/29/2015 Reason for consult: Follow-up assessment   Follow up with mom of 22 hour old infant. Infant with 15 BF for 10-45 minutes, 1 void, 3 stools and 2 emesis since birth. Infant weight 8 lb 11 oz with 0% weight loss since birth. LATCH Scores 8 by bedside RN.  Mom reports infant is latching well. She reports nipple tenderness with latch that improves with feeding. Reviewed using EBM post feeding and Olive/Coconut oil to nipples as needed. She does have a bruise to top of left areola. Mom had infant latched to left breast, infant was sleepy and nursing intermittently. Mom was stimulating infant as needed. Mom reports infant has just fed for 30 minutes on the other breast.   Reviewed all BF information in Taking Care of Baby and Me Booklet. Reviewed BF Basics, Engorgement prevention/treatment reviewed, I/O reviewed, enc mom to maintain feeding log and take to Ped appt. Infant with Ped appt tomorrow. Reviewed BM Storage.Vladimir Crofts. LC Brochure reviewed, mom aware of OP Services, BF Support Groups and LC phone #. Enc mom to call with questions/concerns prn,    Maternal Data Formula Feeding for Exclusion: No Has patient been taught Hand Expression?: Yes Does the patient have breastfeeding experience prior to this delivery?: Yes  Feeding Feeding Type: Breast Fed Length of feed: 15 min  LATCH Score/Interventions Latch: Repeated attempts needed to sustain latch, nipple held in mouth throughout feeding, stimulation needed to elicit sucking reflex.  Audible Swallowing: A few with stimulation Intervention(s): Hand expression;Skin to skin Intervention(s): Skin to skin;Hand expression  Type of Nipple: Everted at rest and after stimulation  Comfort (Breast/Nipple): Filling, red/small blisters or bruises, mild/mod discomfort  Problem noted: Cracked, bleeding, blisters,  bruises;Mild/Moderate discomfort (Bruise to areola, not to nipple) Interventions  (Cracked/bleeding/bruising/blister): Expressed breast milk to nipple  Hold (Positioning): No assistance needed to correctly position infant at breast. Intervention(s): Breastfeeding basics reviewed;Support Pillows;Position options;Skin to skin  LATCH Score: 7  Lactation Tools Discussed/Used WIC Program: No Pump Review: Milk Storage   Consult Status Consult Status: Complete Follow-up type: Call as needed    Ed BlalockSharon S Matias Thurman 12/29/2015, 4:53 PM

## 2015-12-29 NOTE — Discharge Summary (Signed)
OB Discharge Summary     Patient Name: Vanessa SentersMary Matheny DOB: 08/28/1980 MRN: [id number]  Date of admission: 12/28/2015 Delivering MD: Huel CoteICHARDSON, KATHY   Date of discharge: 12/29/2015  Admitting diagnosis: INDUCTION Intrauterine pregnancy: [id number]     Secondary diagnosis:  Active Problems:   Term pregnancy, repeat   NSVD (normal spontaneous vaginal delivery)  Additional problems: none     Discharge diagnosis: Term Pregnancy Delivered                                                                                                Post partum procedures:none  Augmentation: AROM and Pitocin  Complications: None  Hospital course:  Induction of Labor With Vaginal Delivery   35 y.o. yo G2P2002 at [redacted]w[redacted]d was admitted to the hospital 12/28/2015 for induction of labor.  Indication for induction: Favorable cervix at term.  Patient had an uncomplicated labor course as follows: Membrane Rupture Time/Date: 8:24 AM ,12/28/2015   Intrapartum Procedures: Episiotomy: None [1]                                         Lacerations:  None [1]  Patient had delivery of a Viable infant.  Information for the patient's newborn:  Sim BoastMcDonald, Girl Shalayne [[id number]][[id number]]  Delivery Method: Vaginal, Spontaneous Delivery (Filed from Delivery Summary)   12/28/2015  Details of delivery can be found in separate delivery note.  Patient had a routine postpartum course. Patient is discharged home 12/29/15.   Physical exam Vitals:   12/29/15 0100 12/29/15 0534 12/29/15 0900 12/29/15 1753  BP: 124/68 (!) 101/56 122/69 119/67  Pulse: 81 66 72 68  Resp: 18 18 18 18   Temp: 98.4 F (36.9 C) 97.8 F (36.6 C) 98.8 F (37.1 C) 98.8 F (37.1 C)  TempSrc:  Oral Oral Oral  Weight:      Height:       General: alert, cooperative and no distress Lochia: appropriate Uterine Fundus: firm Incision: N/A DVT Evaluation: No evidence of DVT seen on physical exam. No significant calf/ankle edema. Labs: Lab Results  Component Value  Date   WBC 15.2 (H) 12/29/2015   HGB 10.8 (L) 12/29/2015   HCT 33.6 (L) 12/29/2015   MCV 83.2 12/29/2015   PLT 186 12/29/2015   CMP Latest Ref Rng & Units 11/20/2010  Glucose 70 - 99 mg/dL 75  BUN 6 - 23 mg/dL 10  Creatinine 6.210.50 - 3.081.10 mg/dL 6.570.71  Sodium 846135 - 962145 mEq/L 134(L)  Potassium 3.5 - 5.1 mEq/L 3.9  Chloride 96 - 112 mEq/L 102  CO2 19 - 32 mEq/L 23  Calcium 8.4 - 10.5 mg/dL 8.1(L)  Total Protein 6.0 - 8.3 g/dL 6.1  Total Bilirubin 0.3 - 1.2 mg/dL 9.5(M0.2(L)  Alkaline Phos 39 - 117 U/L 119(H)  AST 0 - 37 U/L 18  ALT 0 - 35 U/L 12    Discharge instruction: per After Visit Summary and "Baby and Me Booklet".  After visit meds:    Medication List  TAKE these medications   ibuprofen 600 MG tablet Commonly known as:  ADVIL,MOTRIN Take 1 tablet (600 mg total) by mouth every 6 (six) hours as needed.   prenatal multivitamin Tabs tablet Take 1 tablet by mouth daily at 12 noon.       Diet: routine diet  Activity: Advance as tolerated. Pelvic rest for 6 weeks.   Outpatient follow up:6 weeks Follow up Appt:No future appointments. Follow up Visit:No Follow-up on file.  Postpartum contraception: Natural Family Planning  Newborn Data: Live born female  Birth Weight: 8 lb 11 oz (3941 g) APGAR: 9, 9  Baby Feeding: Breast Disposition:home with mother   12/29/2015 Sharol Given Spelter, DO

## 2015-12-29 NOTE — Progress Notes (Signed)
Patient ID: Vanessa SentersMary Sobotka, female   DOB: 11/03/1980, 35 y.o.   MRN: [id number] Pt doing well. Still bonding well with baby Pending discharge order from pediatrician- if ok requests discharge home today Instructions reviewed Does not desire contraception

## 2015-12-29 NOTE — Progress Notes (Signed)
Post Partum Day 1 Subjective: no complaints, up ad lib, voiding, tolerating PO, + flatus and bonding well with baby. Breastfeeding well. Has no complaints. Mild lochia only  Objective: Blood pressure (!) 101/56, pulse 66, temperature 97.8 F (36.6 C), temperature source Oral, resp. rate 18, height 5\' 9"  (1.753 m), weight 213 lb (96.6 kg), last menstrual period 03/24/2015, unknown if currently breastfeeding.  Physical Exam:  General: alert, cooperative and no distress Lochia: appropriate Uterine Fundus: firm Incision: n/a DVT Evaluation: No evidence of DVT seen on physical exam. No significant calf/ankle edema.   Recent Labs  12/28/15 0740 12/29/15 0517  HGB 12.1 10.8*  HCT 36.9 33.6*    Assessment/Plan: Breastfeeding and Contraception -does not desire any  Was considering early discharge but baby not 24hrs till this pm and has not had bath or pee/pooped so not likely. Will recheck later today   LOS: 1 day   Edwinna AreolaCecilia Worema Banga 12/29/2015, 8:56 AM

## 2015-12-29 NOTE — Discharge Instructions (Signed)
Nothing in vagina for 6 weeks.  No sex, tampons, and douching.  Other instructions as in [facility] Discharge Booklet. °

## 2015-12-30 LAB — RPR: RPR: NONREACTIVE

## 2019-05-01 NOTE — L&D Delivery Note (Signed)
Delivery Note Pt progressed to complete dilation and pushed great. At 12:10 AM a viable female was delivered via Vaginal, Spontaneous (Presentation: Left Occiput Anterior).  APGAR: 9, 9; weight pending  .   Placenta status: Spontaneous, Intact.  Cord: 3 vessels with the following complications: None.  Anesthesia: Epidural Episiotomy: None Lacerations: None Suture Repair: n/a Est. Blood Loss (mL):  Mom to postpartum.  Baby to Couplet care / Skin to Skin.  Oliver Pila 01/28/2020, 12:26 AM

## 2019-07-23 LAB — OB RESULTS CONSOLE RUBELLA ANTIBODY, IGM: Rubella: IMMUNE

## 2019-07-23 LAB — OB RESULTS CONSOLE ABO/RH: RH Type: POSITIVE

## 2019-07-23 LAB — OB RESULTS CONSOLE HEPATITIS B SURFACE ANTIGEN: Hepatitis B Surface Ag: NEGATIVE

## 2019-07-23 LAB — OB RESULTS CONSOLE GC/CHLAMYDIA
Chlamydia: NEGATIVE
Gonorrhea: NEGATIVE

## 2019-07-23 LAB — OB RESULTS CONSOLE ANTIBODY SCREEN: Antibody Screen: NEGATIVE

## 2019-07-23 LAB — OB RESULTS CONSOLE RPR: RPR: NONREACTIVE

## 2019-07-23 LAB — OB RESULTS CONSOLE HIV ANTIBODY (ROUTINE TESTING): HIV: NONREACTIVE

## 2020-01-05 LAB — OB RESULTS CONSOLE GBS: GBS: POSITIVE

## 2020-01-19 ENCOUNTER — Encounter (HOSPITAL_COMMUNITY): Payer: Self-pay | Admitting: *Deleted

## 2020-01-19 ENCOUNTER — Telehealth (HOSPITAL_COMMUNITY): Payer: Self-pay | Admitting: *Deleted

## 2020-01-19 NOTE — Telephone Encounter (Signed)
Preadmission screen  

## 2020-01-22 ENCOUNTER — Encounter (HOSPITAL_COMMUNITY): Payer: Self-pay | Admitting: *Deleted

## 2020-01-22 ENCOUNTER — Telehealth (HOSPITAL_COMMUNITY): Payer: Self-pay | Admitting: *Deleted

## 2020-01-22 NOTE — Telephone Encounter (Signed)
Preadmission screen  

## 2020-01-25 ENCOUNTER — Other Ambulatory Visit (HOSPITAL_COMMUNITY)
Admission: RE | Admit: 2020-01-25 | Discharge: 2020-01-25 | Disposition: A | Discharge status: Home / Self Care | Payer: Medicaid Other | Source: Ambulatory Visit | Attending: Obstetrics and Gynecology | Admitting: Obstetrics and Gynecology

## 2020-01-25 DIAGNOSIS — Z01812 Encounter for preprocedural laboratory examination: Secondary | ICD-10-CM | POA: Insufficient documentation

## 2020-01-25 DIAGNOSIS — Z20822 Contact with and (suspected) exposure to covid-19: Secondary | ICD-10-CM | POA: Diagnosis not present

## 2020-01-25 LAB — SARS CORONAVIRUS 2 (TAT 6-24 HRS): SARS Coronavirus 2: NEGATIVE

## 2020-01-26 ENCOUNTER — Other Ambulatory Visit: Payer: Self-pay | Admitting: Obstetrics and Gynecology

## 2020-01-26 NOTE — H&P (Deleted)
  The note originally documented on this encounter has been moved the the encounter in which it belongs.  

## 2020-01-26 NOTE — H&P (Signed)
Vanessa Herring is a 39 y.o. female G3P2002 at 30 3/7 weeks (EDD 01/31/20 by 12 week US)presenting for IOL agt term.   Prenatal care significant for AMA with low risk NIPT, h/o preeclampsia with prior pregnancy and on baby ASA OB History    Gravida  3   Para  2   Term  2   Preterm      AB      Living  2     SAB      TAB      Ectopic      Multiple  0   Live Births  2         11-20-2010, 37 wks 1. M, 6lbs 6oz, Vaginal Delivery 12-28-2015, 39.6 wks 1. F, 8lbs 11oz, Vaginal Delivery   Past Surgical History:  Procedure Laterality Date  . WISDOM TOOTH EXTRACTION     Family History: family history includes Depression in her mother. Social History:  reports that she has never smoked. She has never used smokeless tobacco. She reports that she does not drink alcohol and does not use drugs.     Maternal Diabetes: No Genetic Screening: Normal Maternal Ultrasounds/Referrals: Normal Fetal Ultrasounds or other Referrals:  None Maternal Substance Abuse:  No Significant Maternal Medications:  None Significant Maternal Lab Results:  Group B Strep positive Other Comments:  None  Review of Systems  Constitutional: Negative for fever.  Gastrointestinal: Negative for abdominal pain.   Maternal Medical History:  Contractions: Frequency: irregular.   Perceived severity is mild.    Fetal activity: Perceived fetal activity is normal.    Prenatal complications: GBS positive      unknown if currently breastfeeding. Maternal Exam:  Uterine Assessment: Contraction strength is mild.  Contraction frequency is irregular.   Abdomen: Fetal presentation: vertex  Introitus: Normal vulva.   Physical Exam Cardiovascular:     Rate and Rhythm: Normal rate and regular rhythm.  Pulmonary:     Effort: Pulmonary effort is normal.  Abdominal:     Palpations: Abdomen is soft.  Genitourinary:    General: Normal vulva.  Neurological:     Mental Status: She is alert.     Prenatal  labs: ABO, Rh: O/Positive/-- (03/25 0000) Antibody: Negative (03/25 0000) Rubella: Immune (03/25 0000) RPR: Nonreactive (03/25 0000)  HBsAg: Negative (03/25 0000)  HIV: Non-reactive (03/25 0000)  GBS: Positive/-- (09/07 0000)  One hour GCT  93 Essential panel neg and NIPT low risk  Assessment/Plan: Pt for IOL at term.  Plan AROM and pitocin.  Epidural prn.   Oliver Pila 01/26/2020, 10:26 PM

## 2020-01-27 ENCOUNTER — Inpatient Hospital Stay (HOSPITAL_COMMUNITY): Payer: Medicaid Other | Admitting: Anesthesiology

## 2020-01-27 ENCOUNTER — Inpatient Hospital Stay (HOSPITAL_COMMUNITY)
Admission: AD | Admit: 2020-01-27 | Discharge: 2020-01-29 | DRG: 807 | Disposition: A | Discharge status: Home / Self Care | Payer: Medicaid Other | Attending: Obstetrics and Gynecology | Admitting: Obstetrics and Gynecology

## 2020-01-27 ENCOUNTER — Inpatient Hospital Stay (HOSPITAL_COMMUNITY): Payer: Medicaid Other

## 2020-01-27 ENCOUNTER — Other Ambulatory Visit: Payer: Self-pay

## 2020-01-27 DIAGNOSIS — O99824 Streptococcus B carrier state complicating childbirth: Secondary | ICD-10-CM | POA: Diagnosis present

## 2020-01-27 DIAGNOSIS — Z3A39 39 weeks gestation of pregnancy: Secondary | ICD-10-CM

## 2020-01-27 DIAGNOSIS — O26893 Other specified pregnancy related conditions, third trimester: Secondary | ICD-10-CM | POA: Diagnosis present

## 2020-01-27 LAB — CBC
HCT: 34.5 % — ABNORMAL LOW (ref 36.0–46.0)
Hemoglobin: 10.8 g/dL — ABNORMAL LOW (ref 12.0–15.0)
MCH: 26.3 pg (ref 26.0–34.0)
MCHC: 31.3 g/dL (ref 30.0–36.0)
MCV: 83.9 fL (ref 80.0–100.0)
Platelets: 284 10*3/uL (ref 150–400)
RBC: 4.11 MIL/uL (ref 3.87–5.11)
RDW: 13.4 % (ref 11.5–15.5)
WBC: 12.7 10*3/uL — ABNORMAL HIGH (ref 4.0–10.5)
nRBC: 0 % (ref 0.0–0.2)

## 2020-01-27 LAB — TYPE AND SCREEN
ABO/RH(D): O POS
Antibody Screen: NEGATIVE

## 2020-01-27 MED ORDER — ACETAMINOPHEN 325 MG PO TABS: 650.0000 mg | ORAL_TABLET | ORAL | Status: DC | PRN

## 2020-01-27 MED ORDER — ONDANSETRON HCL 4 MG/2ML IJ SOLN: 4.0000 mg | Freq: Four times a day (QID) | INTRAMUSCULAR | Status: DC | PRN

## 2020-01-27 MED ORDER — OXYTOCIN-SODIUM CHLORIDE 30-0.9 UT/500ML-% IV SOLN
1.0000 m[IU]/min | INTRAVENOUS | Status: DC
  Administered 2020-01-27: 2 m[IU]/min via INTRAVENOUS
  Filled 2020-01-27: qty 500

## 2020-01-27 MED ORDER — SODIUM CHLORIDE 0.9 % IV SOLN
5.0000 10*6.[IU] | Freq: Once | INTRAVENOUS | Status: AC
  Administered 2020-01-27: 5 10*6.[IU] via INTRAVENOUS
  Filled 2020-01-27: qty 5

## 2020-01-27 MED ORDER — DIPHENHYDRAMINE HCL 50 MG/ML IJ SOLN: 12.5000 mg | INTRAMUSCULAR | Status: DC | PRN

## 2020-01-27 MED ORDER — LACTATED RINGERS IV SOLN: 500.0000 mL | Freq: Once | INTRAVENOUS | Status: DC

## 2020-01-27 MED ORDER — LIDOCAINE HCL (PF) 1 % IJ SOLN
INTRAMUSCULAR | Status: DC | PRN
  Administered 2020-01-27: 10 mL via EPIDURAL

## 2020-01-27 MED ORDER — OXYCODONE-ACETAMINOPHEN 5-325 MG PO TABS: 2.0000 | ORAL_TABLET | ORAL | Status: DC | PRN

## 2020-01-27 MED ORDER — LACTATED RINGERS IV SOLN: 500.0000 mL | INTRAVENOUS | Status: DC | PRN

## 2020-01-27 MED ORDER — BUTORPHANOL TARTRATE 1 MG/ML IJ SOLN: 1.0000 mg | INTRAMUSCULAR | Status: DC | PRN

## 2020-01-27 MED ORDER — EPHEDRINE 5 MG/ML INJ: 10.0000 mg | INTRAVENOUS | Status: DC | PRN

## 2020-01-27 MED ORDER — TERBUTALINE SULFATE 1 MG/ML IJ SOLN: 0.2500 mg | Freq: Once | INTRAMUSCULAR | Status: DC | PRN

## 2020-01-27 MED ORDER — LACTATED RINGERS IV SOLN: INTRAVENOUS | Status: DC

## 2020-01-27 MED ORDER — PHENYLEPHRINE 40 MCG/ML (10ML) SYRINGE FOR IV PUSH (FOR BLOOD PRESSURE SUPPORT): 80.0000 ug | PREFILLED_SYRINGE | INTRAVENOUS | Status: DC | PRN

## 2020-01-27 MED ORDER — PENICILLIN G POT IN DEXTROSE 60000 UNIT/ML IV SOLN
3.0000 10*6.[IU] | INTRAVENOUS | Status: DC
  Administered 2020-01-27: 3 10*6.[IU] via INTRAVENOUS
  Filled 2020-01-27: qty 50

## 2020-01-27 MED ORDER — FENTANYL-BUPIVACAINE-NACL 0.5-0.125-0.9 MG/250ML-% EP SOLN
12.0000 mL/h | EPIDURAL | Status: DC | PRN
  Filled 2020-01-27: qty 250

## 2020-01-27 MED ORDER — PHENYLEPHRINE 40 MCG/ML (10ML) SYRINGE FOR IV PUSH (FOR BLOOD PRESSURE SUPPORT)
80.0000 ug | PREFILLED_SYRINGE | INTRAVENOUS | Status: DC | PRN
  Administered 2020-01-27 (×2): 80 ug via INTRAVENOUS
  Filled 2020-01-27: qty 10

## 2020-01-27 MED ORDER — OXYTOCIN BOLUS FROM INFUSION
333.0000 mL | Freq: Once | INTRAVENOUS | Status: AC
  Administered 2020-01-28: 333 mL via INTRAVENOUS

## 2020-01-27 MED ORDER — OXYCODONE-ACETAMINOPHEN 5-325 MG PO TABS: 1.0000 | ORAL_TABLET | ORAL | Status: DC | PRN

## 2020-01-27 MED ORDER — SODIUM CHLORIDE (PF) 0.9 % IJ SOLN
INTRAMUSCULAR | Status: DC | PRN
Start: 2020-01-27 — End: 2020-01-29
  Administered 2020-01-27: 12 mL/h via EPIDURAL

## 2020-01-27 MED ORDER — OXYTOCIN-SODIUM CHLORIDE 30-0.9 UT/500ML-% IV SOLN: 2.5000 [IU]/h | INTRAVENOUS | Status: DC

## 2020-01-27 MED ORDER — LIDOCAINE HCL (PF) 1 % IJ SOLN: 30.0000 mL | INTRAMUSCULAR | Status: DC | PRN

## 2020-01-27 MED ORDER — SOD CITRATE-CITRIC ACID 500-334 MG/5ML PO SOLN: 30.0000 mL | ORAL | Status: DC | PRN

## 2020-01-27 NOTE — Progress Notes (Signed)
Patient ID: Vanessa Herring, female   DOB: 04-Aug-1980, 39 y.o.   MRN: [id number] Pt admitted for IOL about 230 pm due to busy L&D.  She is started on pitocin and has received her PCN for + GBS and is  feeling mild contractions  afeb VSS FHR category 1  Cervix 50/2/-2  AROM clear  Continue pitocin Epidural prn

## 2020-01-27 NOTE — Progress Notes (Signed)
Patient ID: Vanessa Herring, female   DOB: 1980-07-30, 39 y.o.   MRN: [id number] Pt comfortable with epidural  afeb VSS FHR category 1, has had a couple of spontaneous decelerations that resolved with position change  Cervix at 2045 4/70/-1  Continue to follow progress. IUPC in to adjust pitocin

## 2020-01-27 NOTE — Anesthesia Procedure Notes (Signed)
Epidural Patient location during procedure: OB Start time: 01/27/2020 8:12 PM End time: 01/27/2020 8:22 PM  Staffing Anesthesiologist: Lucretia Kern, MD Performed: anesthesiologist   Preanesthetic Checklist Completed: patient identified, IV checked, risks and benefits discussed, monitors and equipment checked, pre-op evaluation and timeout performed  Epidural Patient position: sitting Prep: DuraPrep Patient monitoring: heart rate, continuous pulse ox and blood pressure Approach: midline Location: L3-L4 Injection technique: LOR air  Needle:  Needle type: Tuohy  Needle gauge: 17 G Needle length: 9 cm Needle insertion depth: 7 cm Catheter type: closed end flexible Catheter size: 19 Gauge Catheter at skin depth: 12 cm Test dose: negative  Assessment Events: blood not aspirated, injection not painful, no injection resistance, no paresthesia and negative IV test  Additional Notes Reason for block:procedure for pain

## 2020-01-27 NOTE — Anesthesia Preprocedure Evaluation (Signed)

## 2020-01-28 ENCOUNTER — Encounter (HOSPITAL_COMMUNITY): Payer: Self-pay | Admitting: Obstetrics and Gynecology

## 2020-01-28 LAB — CBC
HCT: 32.9 % — ABNORMAL LOW (ref 36.0–46.0)
Hemoglobin: 10.5 g/dL — ABNORMAL LOW (ref 12.0–15.0)
MCH: 26.8 pg (ref 26.0–34.0)
MCHC: 31.9 g/dL (ref 30.0–36.0)
MCV: 83.9 fL (ref 80.0–100.0)
Platelets: 239 10*3/uL (ref 150–400)
RBC: 3.92 MIL/uL (ref 3.87–5.11)
RDW: 13.5 % (ref 11.5–15.5)
WBC: 17.7 10*3/uL — ABNORMAL HIGH (ref 4.0–10.5)
nRBC: 0 % (ref 0.0–0.2)

## 2020-01-28 LAB — RPR: RPR Ser Ql: NONREACTIVE

## 2020-01-28 MED ORDER — COCONUT OIL OIL: 1.0000 "application " | TOPICAL_OIL | Status: DC | PRN

## 2020-01-28 MED ORDER — WITCH HAZEL-GLYCERIN EX PADS: 1.0000 "application " | MEDICATED_PAD | CUTANEOUS | Status: DC | PRN

## 2020-01-28 MED ORDER — ONDANSETRON HCL 4 MG/2ML IJ SOLN: 4.0000 mg | INTRAMUSCULAR | Status: DC | PRN

## 2020-01-28 MED ORDER — DIPHENHYDRAMINE HCL 25 MG PO CAPS: 25.0000 mg | ORAL_CAPSULE | Freq: Four times a day (QID) | ORAL | Status: DC | PRN

## 2020-01-28 MED ORDER — SENNOSIDES-DOCUSATE SODIUM 8.6-50 MG PO TABS
2.0000 | ORAL_TABLET | ORAL | Status: DC
  Administered 2020-01-28: 2 via ORAL
  Filled 2020-01-28: qty 2

## 2020-01-28 MED ORDER — SIMETHICONE 80 MG PO CHEW: 80.0000 mg | CHEWABLE_TABLET | ORAL | Status: DC | PRN

## 2020-01-28 MED ORDER — TETANUS-DIPHTH-ACELL PERTUSSIS 5-2.5-18.5 LF-MCG/0.5 IM SUSP: 0.5000 mL | Freq: Once | INTRAMUSCULAR | Status: DC

## 2020-01-28 MED ORDER — ACETAMINOPHEN 325 MG PO TABS
650.0000 mg | ORAL_TABLET | ORAL | Status: DC | PRN
  Filled 2020-01-28: qty 2

## 2020-01-28 MED ORDER — PRENATAL MULTIVITAMIN CH
1.0000 | ORAL_TABLET | Freq: Every day | ORAL | Status: DC
  Administered 2020-01-28: 1 via ORAL
  Filled 2020-01-28: qty 1

## 2020-01-28 MED ORDER — ZOLPIDEM TARTRATE 5 MG PO TABS: 5.0000 mg | ORAL_TABLET | Freq: Every evening | ORAL | Status: DC | PRN

## 2020-01-28 MED ORDER — BENZOCAINE-MENTHOL 20-0.5 % EX AERO: 1.0000 "application " | INHALATION_SPRAY | CUTANEOUS | Status: DC | PRN

## 2020-01-28 MED ORDER — ONDANSETRON HCL 4 MG PO TABS: 4.0000 mg | ORAL_TABLET | ORAL | Status: DC | PRN

## 2020-01-28 MED ORDER — IBUPROFEN 600 MG PO TABS
600.0000 mg | ORAL_TABLET | Freq: Four times a day (QID) | ORAL | Status: DC
  Administered 2020-01-28 – 2020-01-29 (×5): 600 mg via ORAL
  Filled 2020-01-28 (×5): qty 1

## 2020-01-28 MED ORDER — DIBUCAINE (PERIANAL) 1 % EX OINT: 1.0000 "application " | TOPICAL_OINTMENT | CUTANEOUS | Status: DC | PRN

## 2020-01-28 NOTE — Anesthesia Postprocedure Evaluation (Signed)
Anesthesia Post Note  Patient: Vanessa Herring  Procedure(s) Performed: AN AD HOC LABOR EPIDURAL     Patient location during evaluation: Mother Baby Anesthesia Type: Epidural Level of consciousness: awake and alert Pain management: pain level controlled Vital Signs Assessment: post-procedure vital signs reviewed and stable Respiratory status: spontaneous breathing, nonlabored ventilation and respiratory function stable Cardiovascular status: stable Postop Assessment: no headache, no backache and epidural receding Anesthetic complications: no   No complications documented.  Last Vitals:  Vitals:   01/28/20 0403 01/28/20 0556  BP: 111/73 115/76  Pulse: 89 73  Resp: 18 18  Temp: 36.7 C 36.7 C  SpO2: 97% 98%    Last Pain:  Vitals:   01/28/20 0556  TempSrc: Oral  PainSc: 5    Pain Goal:                   [employer]

## 2020-01-28 NOTE — Progress Notes (Signed)
POSTPARTUM PROGRESS NOTE  Post Partum Day #1  Subjective:  No acute events overnight.  Pt denies problems with ambulating, voiding or po intake.  She denies nausea or vomiting.  Pain is well controlled.  She has had flatus. She has not had bowel movement.  Lochia Minimal. Breastfeeding without issue, baby just had large BM  Objective: Blood pressure 115/71, pulse 66, temperature 98.2 F (36.8 C), temperature source Oral, resp. rate 18, height 5\' 9"  (1.753 m), weight 98 kg, SpO2 98 %, unknown if currently breastfeeding.  Physical Exam:  General: alert, cooperative and no distress Lochia:normal flow Chest: CTAB Heart: RRR no m/r/g Abdomen: +BS, soft, nontender Uterine Fundus: firm, 2cm below umbilicus Extremities: neg edema, neg calf TTP BL, neg Homans BL  Recent Labs    01/27/20 1441 01/28/20 0538  HGB 10.8* 10.5*  HCT 34.5* 32.9*    Assessment/Plan:  ASSESSMENT: Vanessa Herring is a 39 y.o. G3P3003 s/p SVD @ [redacted]w[redacted]d. PNC c/b AMA, h/o PreE.   Plan for discharge tomorrow and Breastfeeding   LOS: 1 day

## 2020-01-28 NOTE — Lactation Note (Signed)
This note was copied from a baby's chart. Lactation Consultation Note  Patient Name: Vanessa Herring [id number] Date: 01/28/2020 Reason for consult: Initial assessment;Term  P3 mom with BF exp. With second child age 39 (BF 2.5 yrs.)  Mom attempted to BF with LC in room.  Infant was cueing mom attempted to latch in cradle hole.  LC suggested STS and reviewed importance of STS.  Infant opened wide to latch, mom used cradle position.  Infant latched onto nipple but then after a few sucks tongue thrusted the nipple out of her mouth and would then try to relatch.    LC suggested using more head and neck support for infant when latching and support the breast (cross cradle.  Then after infant sustains latch, mom could readjust into cradle.  Mom tried this and infant did achieve a deeper latch.  Mom felt this was a much better and deeper latch than she had been doing.    Cheeks were flush to breast, swallows heard spontaneously, and gape was wide. Continuous rhythmic jaw movement observed.    BF basics reviewed and questions answered regarding when to begin pumping and offering a bottle.    Mom was provided a lactation brochure and is aware of phone line and services, OP appt, BFSG.     Maternal Data Does the patient have breastfeeding experience prior to this delivery?: Yes  Feeding Feeding Type: Breast Fed  LATCH Score Latch: Repeated attempts needed to sustain latch, nipple held in mouth throughout feeding, stimulation needed to elicit sucking reflex.  Audible Swallowing: Spontaneous and intermittent  Type of Nipple: Everted at rest and after stimulation  Comfort (Breast/Nipple): Soft / non-tender  Hold (Positioning): Assistance needed to correctly position infant at breast and maintain latch.  LATCH Score: 8  Interventions Interventions: Breast feeding basics reviewed;Assisted with latch;Skin to skin;Breast compression;Adjust position  Lactation Tools Discussed/Used      Consult Status Consult Status: Follow-up Date: 01/29/20 Follow-up type: In-patient    Maryruth Hancock [facility] 01/28/2020, 12:43 PM

## 2020-01-28 NOTE — Progress Notes (Signed)
Chart review, patient resting at this time: VSS H/H 10.5/32.9  Will check in in PM

## 2020-01-29 ENCOUNTER — Encounter (HOSPITAL_COMMUNITY): Payer: Self-pay | Admitting: Obstetrics and Gynecology

## 2020-01-29 MED ORDER — PRENATAL MULTIVITAMIN CH: 1.0000 | ORAL_TABLET | Freq: Every day | ORAL | 3 refills | Status: AC

## 2020-01-29 MED ORDER — IBUPROFEN 600 MG PO TABS: 600.0000 mg | ORAL_TABLET | Freq: Four times a day (QID) | ORAL | 1 refills | Status: AC | PRN

## 2020-01-29 NOTE — Discharge Summary (Signed)
Postpartum Discharge Summary      Patient Name: Vanessa Herring DOB: 1981/04/06 MRN: [id number]  Date of admission: 01/27/2020 Delivery date:01/28/2020  Delivering provider: Paula Compton  Date of discharge: 01/29/2020  Admitting diagnosis: Indication for care in labor or delivery [O75.9] NSVD (normal spontaneous vaginal delivery) [O80] Intrauterine pregnancy: [redacted]w[redacted]d     Secondary diagnosis:  Active Problems:   NSVD (normal spontaneous vaginal delivery)   Indication for care in labor or delivery  Additional problems: N/a    Discharge diagnosis: Term Pregnancy Delivered                                              Post partum procedures:N/A Augmentation: AROM and Pitocin Complications: None  Hospital course: Induction of Labor With Vaginal Delivery   39 y.o. yo G3P3003 at [redacted]w[redacted]d was admitted to the hospital 01/27/2020 for induction of labor.  Indication for induction: Favorable cervix at term.  Patient had an uncomplicated labor course as follows: Membrane Rupture Time/Date: 5:22 PM ,01/27/2020   Delivery Method:Vaginal, Spontaneous  Episiotomy: None  Lacerations:  None  Details of delivery can be found in separate delivery note.  Patient had a routine postpartum course. Patient is discharged home 01/29/20.  Newborn Data: Birth date:01/28/2020  Birth time:12:10 AM  Gender:Female  Living status:Living  Apgars:8 ,9  Weight:3750 g   Magnesium Sulfate received: No BMZ received: No Rhophylac:No MMR:No T-DaP:Given prenatally Flu: No Transfusion:No  Physical exam  Vitals:   01/28/20 0936 01/28/20 1415 01/28/20 2133 01/29/20 0517  BP: (!) 107/59 115/71 121/82 111/69  Pulse: 72 66 67 75  Resp: $Remo'17 18  18  'AKzeS$ Temp: 98.3 F (36.8 C) 98.2 F (36.8 C) 98.1 F (36.7 C) 98.5 F (36.9 C)  TempSrc: Oral Oral Oral Oral  SpO2:   98% 95%  Weight:      Height:       General: alert and no distress Lochia: appropriate Uterine Fundus: firm  Labs: Lab Results  Component Value  Date   WBC 17.7 (H) 01/28/2020   HGB 10.5 (L) 01/28/2020   HCT 32.9 (L) 01/28/2020   MCV 83.9 01/28/2020   PLT 239 01/28/2020   CMP Latest Ref Rng & Units 11/20/2010  Glucose 70 - 99 mg/dL 75  BUN 6 - 23 mg/dL 10  Creatinine 0.50 - 1.10 mg/dL 0.71  Sodium 135 - 145 mEq/L 134(L)  Potassium 3.5 - 5.1 mEq/L 3.9  Chloride 96 - 112 mEq/L 102  CO2 19 - 32 mEq/L 23  Calcium 8.4 - 10.5 mg/dL 8.1(L)  Total Protein 6.0 - 8.3 g/dL 6.1  Total Bilirubin 0.3 - 1.2 mg/dL 0.2(L)  Alkaline Phos 39 - 117 U/L 119(H)  AST 0 - 37 U/L 18  ALT 0 - 35 U/L 12   Edinburgh Score: Edinburgh Postnatal Depression Scale Screening Tool 01/28/2020  I have been able to laugh and see the funny side of things. 0  I have looked forward with enjoyment to things. 0  I have blamed myself unnecessarily when things went wrong. 1  I have been anxious or worried for no good reason. 1  I have felt scared or panicky for no good reason. 1  Things have been getting on top of me. 1  I have been so unhappy that I have had difficulty sleeping. 0  I have felt sad or miserable. 0  I  have been so unhappy that I have been crying. 0  The thought of harming myself has occurred to me. 0  Edinburgh Postnatal Depression Scale Total 4      After visit meds:  Allergies as of 01/29/2020   No Known Allergies     Medication List    TAKE these medications   ibuprofen 600 MG tablet Commonly known as: ADVIL Take 1 tablet (600 mg total) by mouth every 6 (six) hours as needed for moderate pain. What changed: reasons to take this   prenatal multivitamin Tabs tablet Take 1 tablet by mouth daily at 12 noon.        Discharge home in stable condition Infant Feeding: Breast Infant Disposition:home with mother Discharge instruction: per After Visit Summary and Postpartum booklet. Activity: Advance as tolerated. Pelvic rest for 6 weeks.  Diet: routine diet Anticipated Birth Control: Unsure Postpartum Appointment:6  weeks Additional Postpartum F/U: N/A Future Appointments:No future appointments. Follow up Visit:  Follow-up Information    Shivaji, Melida Quitter, MD. Schedule an appointment as soon as possible for a visit in 6 week(s).   Specialty: Obstetrics and Gynecology Why: for postpartum exam Contact information: [place] [address] [phone]                   01/29/2020 Janyth Contes, MD

## 2020-01-29 NOTE — Progress Notes (Signed)
Post Partum Day 1 Subjective: no complaints, up ad lib, voiding, tolerating PO and nl lochia, pain controlled  Objective: Blood pressure 111/69, pulse 75, temperature 98.5 F (36.9 C), temperature source Oral, resp. rate 18, height 5\' 9"  (1.753 m), weight 98 kg, SpO2 95 %, unknown if currently breastfeeding.  Physical Exam:  General: alert and no distress Lochia: appropriate Uterine Fundus: firm   Recent Labs    01/27/20 1441 01/28/20 0538  HGB 10.8* 10.5*  HCT 34.5* 32.9*    Assessment/Plan: Discharge home, Breastfeeding and Lactation consult.  Routine PP care.  D/C with motrin and PNV.  F/u in 6 weeks   LOS: 2 days   Tim Wilhide Bovard-Stuckert 01/29/2020, 8:14 AM

## 2021-05-16 ENCOUNTER — Ambulatory Visit: Payer: Medicaid Other | Admitting: Neurology
